# Patient Record
Sex: Male | Born: 1985 | Race: White | Hispanic: No | Marital: Single | Smoking: Never smoker
Health system: Southern US, Community
[De-identification: ages and names within clinical notes are randomized; demographics above are authoritative.]

## PROBLEM LIST (undated history)

## (undated) DIAGNOSIS — Z8547 Personal history of malignant neoplasm of testis: Secondary | ICD-10-CM

## (undated) DIAGNOSIS — F411 Generalized anxiety disorder: Secondary | ICD-10-CM

## (undated) DIAGNOSIS — E782 Mixed hyperlipidemia: Secondary | ICD-10-CM

## (undated) DIAGNOSIS — J45909 Unspecified asthma, uncomplicated: Secondary | ICD-10-CM

## (undated) DIAGNOSIS — J4520 Mild intermittent asthma, uncomplicated: Secondary | ICD-10-CM

## (undated) DIAGNOSIS — I10 Essential (primary) hypertension: Secondary | ICD-10-CM

## (undated) DIAGNOSIS — E119 Type 2 diabetes mellitus without complications: Secondary | ICD-10-CM

## (undated) HISTORY — DX: Personal history of malignant neoplasm of testis: Z85.47

## (undated) HISTORY — DX: Mixed hyperlipidemia: E78.2

## (undated) HISTORY — DX: Mild intermittent asthma, uncomplicated: J45.20

## (undated) HISTORY — DX: Generalized anxiety disorder: F41.1

## (undated) HISTORY — DX: Morbid (severe) obesity due to excess calories: E66.01

---

## 2012-10-12 DIAGNOSIS — R109 Unspecified abdominal pain: Secondary | ICD-10-CM | POA: Insufficient documentation

## 2012-10-13 DIAGNOSIS — E669 Obesity, unspecified: Secondary | ICD-10-CM

## 2012-10-13 DIAGNOSIS — N201 Calculus of ureter: Secondary | ICD-10-CM | POA: Insufficient documentation

## 2015-02-04 ENCOUNTER — Other Ambulatory Visit (HOSPITAL_COMMUNITY): Payer: Self-pay | Admitting: Urology

## 2015-02-04 DIAGNOSIS — N5089 Other specified disorders of the male genital organs: Secondary | ICD-10-CM

## 2015-02-05 ENCOUNTER — Encounter (HOSPITAL_COMMUNITY): Payer: Self-pay

## 2015-02-05 ENCOUNTER — Ambulatory Visit (HOSPITAL_COMMUNITY)
Admission: RE | Admit: 2015-02-05 | Discharge: 2015-02-05 | Disposition: A | Discharge status: Home / Self Care | Payer: BLUE CROSS/BLUE SHIELD | Source: Ambulatory Visit | Attending: Urology | Admitting: Urology

## 2015-02-05 DIAGNOSIS — N5089 Other specified disorders of the male genital organs: Secondary | ICD-10-CM

## 2015-02-05 DIAGNOSIS — N508 Other specified disorders of male genital organs: Secondary | ICD-10-CM | POA: Diagnosis not present

## 2015-02-05 DIAGNOSIS — K439 Ventral hernia without obstruction or gangrene: Secondary | ICD-10-CM | POA: Insufficient documentation

## 2015-02-05 HISTORY — DX: Unspecified asthma, uncomplicated: J45.909

## 2015-02-05 HISTORY — DX: Essential (primary) hypertension: I10

## 2015-02-05 HISTORY — DX: Type 2 diabetes mellitus without complications: E11.9

## 2015-02-05 MED ORDER — IOHEXOL 350 MG/ML SOLN
100.0000 mL | Freq: Once | INTRAVENOUS | Status: AC | PRN
  Administered 2015-02-05: 100 mL via INTRAVENOUS

## 2015-02-12 ENCOUNTER — Other Ambulatory Visit (HOSPITAL_COMMUNITY): Payer: Self-pay | Admitting: Urology

## 2015-02-12 ENCOUNTER — Ambulatory Visit (HOSPITAL_COMMUNITY)
Admission: RE | Admit: 2015-02-12 | Discharge: 2015-02-12 | Disposition: A | Discharge status: Home / Self Care | Payer: BLUE CROSS/BLUE SHIELD | Source: Ambulatory Visit | Attending: Urology | Admitting: Urology

## 2015-02-12 ENCOUNTER — Other Ambulatory Visit: Payer: Self-pay | Admitting: Urology

## 2015-02-12 DIAGNOSIS — N509 Disorder of male genital organs, unspecified: Secondary | ICD-10-CM | POA: Insufficient documentation

## 2015-02-12 DIAGNOSIS — E119 Type 2 diabetes mellitus without complications: Secondary | ICD-10-CM | POA: Insufficient documentation

## 2015-02-12 DIAGNOSIS — I10 Essential (primary) hypertension: Secondary | ICD-10-CM | POA: Insufficient documentation

## 2015-02-12 DIAGNOSIS — Z01818 Encounter for other preprocedural examination: Secondary | ICD-10-CM | POA: Insufficient documentation

## 2015-02-12 DIAGNOSIS — J45909 Unspecified asthma, uncomplicated: Secondary | ICD-10-CM | POA: Diagnosis not present

## 2015-02-16 ENCOUNTER — Encounter (HOSPITAL_COMMUNITY): Payer: Self-pay | Admitting: *Deleted

## 2015-02-17 ENCOUNTER — Encounter (HOSPITAL_COMMUNITY): Payer: Self-pay | Admitting: *Deleted

## 2015-02-19 ENCOUNTER — Ambulatory Visit (HOSPITAL_COMMUNITY)
Admission: RE | Admit: 2015-02-19 | Discharge: 2015-02-19 | Disposition: A | Discharge status: Home / Self Care | Payer: BLUE CROSS/BLUE SHIELD | Source: Ambulatory Visit | Attending: Urology | Admitting: Urology

## 2015-02-19 ENCOUNTER — Encounter (HOSPITAL_COMMUNITY)
Admission: RE | Disposition: A | Discharge status: Home / Self Care | Payer: Self-pay | Source: Ambulatory Visit | Attending: Urology

## 2015-02-19 ENCOUNTER — Ambulatory Visit (HOSPITAL_COMMUNITY): Payer: BLUE CROSS/BLUE SHIELD | Admitting: Anesthesiology

## 2015-02-19 ENCOUNTER — Encounter (HOSPITAL_COMMUNITY): Payer: Self-pay | Admitting: *Deleted

## 2015-02-19 DIAGNOSIS — Z79899 Other long term (current) drug therapy: Secondary | ICD-10-CM | POA: Diagnosis not present

## 2015-02-19 DIAGNOSIS — E119 Type 2 diabetes mellitus without complications: Secondary | ICD-10-CM | POA: Insufficient documentation

## 2015-02-19 DIAGNOSIS — I10 Essential (primary) hypertension: Secondary | ICD-10-CM | POA: Insufficient documentation

## 2015-02-19 DIAGNOSIS — N442 Benign cyst of testis: Secondary | ICD-10-CM | POA: Diagnosis not present

## 2015-02-19 DIAGNOSIS — J45909 Unspecified asthma, uncomplicated: Secondary | ICD-10-CM | POA: Insufficient documentation

## 2015-02-19 DIAGNOSIS — N508 Other specified disorders of male genital organs: Secondary | ICD-10-CM | POA: Diagnosis present

## 2015-02-19 DIAGNOSIS — Z6841 Body Mass Index (BMI) 40.0 and over, adult: Secondary | ICD-10-CM | POA: Insufficient documentation

## 2015-02-19 HISTORY — PX: ORCHIECTOMY: SHX2116

## 2015-02-19 LAB — BASIC METABOLIC PANEL
Anion gap: 8 (ref 5–15)
BUN: 14 mg/dL (ref 6–20)
CO2: 29 mmol/L (ref 22–32)
Calcium: 9 mg/dL (ref 8.9–10.3)
Chloride: 101 mmol/L (ref 101–111)
Creatinine, Ser: 0.79 mg/dL (ref 0.61–1.24)
Glucose, Bld: 166 mg/dL — ABNORMAL HIGH (ref 65–99)
Potassium: 4.5 mmol/L (ref 3.5–5.1)
Sodium: 138 mmol/L (ref 135–145)

## 2015-02-19 LAB — GLUCOSE, CAPILLARY
GLUCOSE-CAPILLARY: 164 mg/dL — AB (ref 65–99)
GLUCOSE-CAPILLARY: 166 mg/dL — AB (ref 65–99)

## 2015-02-19 LAB — CBC
HEMATOCRIT: 46.8 % (ref 39.0–52.0)
HEMOGLOBIN: 15.2 g/dL (ref 13.0–17.0)
MCH: 27.4 pg (ref 26.0–34.0)
MCHC: 32.5 g/dL (ref 30.0–36.0)
MCV: 84.5 fL (ref 78.0–100.0)
PLATELETS: 160 10*3/uL (ref 150–400)
RBC: 5.54 MIL/uL (ref 4.22–5.81)
RDW: 13.8 % (ref 11.5–15.5)
WBC: 5.5 10*3/uL (ref 4.0–10.5)

## 2015-02-19 SURGERY — ORCHIECTOMY
Anesthesia: General | Laterality: Left

## 2015-02-19 MED ORDER — MIDAZOLAM HCL 2 MG/2ML IJ SOLN
INTRAMUSCULAR | Status: AC
  Filled 2015-02-19: qty 2

## 2015-02-19 MED ORDER — OXYCODONE-ACETAMINOPHEN 5-325 MG PO TABS: 1.0000 | ORAL_TABLET | ORAL | Status: DC | PRN

## 2015-02-19 MED ORDER — OXYCODONE-ACETAMINOPHEN 5-325 MG PO TABS
1.0000 | ORAL_TABLET | ORAL | Status: DC | PRN
  Administered 2015-02-19: 1 via ORAL
  Filled 2015-02-19: qty 1

## 2015-02-19 MED ORDER — MEPERIDINE HCL 50 MG/ML IJ SOLN: 6.2500 mg | INTRAMUSCULAR | Status: DC | PRN

## 2015-02-19 MED ORDER — CEFAZOLIN SODIUM 10 G IJ SOLR
3.0000 g | INTRAMUSCULAR | Status: AC
  Administered 2015-02-19: 3 g via INTRAVENOUS
  Filled 2015-02-19: qty 3000

## 2015-02-19 MED ORDER — LACTATED RINGERS IV SOLN: INTRAVENOUS | Status: DC

## 2015-02-19 MED ORDER — PROMETHAZINE HCL 25 MG/ML IJ SOLN: 6.2500 mg | INTRAMUSCULAR | Status: DC | PRN

## 2015-02-19 MED ORDER — FENTANYL CITRATE (PF) 100 MCG/2ML IJ SOLN
INTRAMUSCULAR | Status: AC
  Filled 2015-02-19: qty 2

## 2015-02-19 MED ORDER — LIDOCAINE HCL (CARDIAC) 20 MG/ML IV SOLN
INTRAVENOUS | Status: DC | PRN
  Administered 2015-02-19: 100 mg via INTRAVENOUS

## 2015-02-19 MED ORDER — SODIUM CHLORIDE 0.9 % IJ SOLN
INTRAMUSCULAR | Status: AC
  Filled 2015-02-19: qty 10

## 2015-02-19 MED ORDER — LIDOCAINE HCL (CARDIAC) 20 MG/ML IV SOLN
INTRAVENOUS | Status: AC
  Filled 2015-02-19: qty 5

## 2015-02-19 MED ORDER — LACTATED RINGERS IV SOLN
INTRAVENOUS | Status: DC | PRN
  Administered 2015-02-19 (×2): via INTRAVENOUS

## 2015-02-19 MED ORDER — PROPOFOL 10 MG/ML IV BOLUS
INTRAVENOUS | Status: DC | PRN
  Administered 2015-02-19: 300 mg via INTRAVENOUS

## 2015-02-19 MED ORDER — ACETAMINOPHEN 10 MG/ML IV SOLN
INTRAVENOUS | Status: DC | PRN
  Administered 2015-02-19: 1000 mg via INTRAVENOUS

## 2015-02-19 MED ORDER — EPHEDRINE SULFATE 50 MG/ML IJ SOLN
INTRAMUSCULAR | Status: AC
  Filled 2015-02-19: qty 1

## 2015-02-19 MED ORDER — ACETAMINOPHEN 10 MG/ML IV SOLN
INTRAVENOUS | Status: AC
  Filled 2015-02-19: qty 100

## 2015-02-19 MED ORDER — BUPIVACAINE-EPINEPHRINE (PF) 0.25% -1:200000 IJ SOLN
INTRAMUSCULAR | Status: AC
  Filled 2015-02-19: qty 30

## 2015-02-19 MED ORDER — FENTANYL CITRATE (PF) 100 MCG/2ML IJ SOLN
INTRAMUSCULAR | Status: DC | PRN
  Administered 2015-02-19 (×8): 25 ug via INTRAVENOUS

## 2015-02-19 MED ORDER — MIDAZOLAM HCL 5 MG/5ML IJ SOLN
INTRAMUSCULAR | Status: DC | PRN
  Administered 2015-02-19: 2 mg via INTRAVENOUS

## 2015-02-19 MED ORDER — PROPOFOL 10 MG/ML IV BOLUS
INTRAVENOUS | Status: AC
  Filled 2015-02-19: qty 20

## 2015-02-19 MED ORDER — FENTANYL CITRATE (PF) 100 MCG/2ML IJ SOLN
25.0000 ug | INTRAMUSCULAR | Status: DC | PRN
  Administered 2015-02-19 (×4): 25 ug via INTRAVENOUS

## 2015-02-19 MED ORDER — BUPIVACAINE-EPINEPHRINE (PF) 0.25% -1:200000 IJ SOLN
INTRAMUSCULAR | Status: DC | PRN
  Administered 2015-02-19: 20 mL

## 2015-02-19 MED ORDER — ONDANSETRON HCL 4 MG/2ML IJ SOLN
INTRAMUSCULAR | Status: DC | PRN
  Administered 2015-02-19: 4 mg via INTRAVENOUS

## 2015-02-19 MED ORDER — 0.9 % SODIUM CHLORIDE (POUR BTL) OPTIME
TOPICAL | Status: DC | PRN
  Administered 2015-02-19: 1000 mL

## 2015-02-19 MED ORDER — SULFAMETHOXAZOLE-TRIMETHOPRIM 800-160 MG PO TABS: 1.0000 | ORAL_TABLET | Freq: Two times a day (BID) | ORAL | Status: DC

## 2015-02-19 MED ORDER — KETOROLAC TROMETHAMINE 30 MG/ML IJ SOLN
INTRAMUSCULAR | Status: DC | PRN
  Administered 2015-02-19: 30 mg via INTRAVENOUS

## 2015-02-19 SURGICAL SUPPLY — 32 items
BENZOIN TINCTURE PRP APPL 2/3 (GAUZE/BANDAGES/DRESSINGS) IMPLANT
BLADE HEX COATED 2.75 (ELECTRODE) ×2 IMPLANT
BLADE SURG 15 STRL LF DISP TIS (BLADE) ×1 IMPLANT
BLADE SURG 15 STRL SS (BLADE) ×1
BNDG GAUZE ELAST 4 BULKY (GAUZE/BANDAGES/DRESSINGS) ×2 IMPLANT
COVER SURGICAL LIGHT HANDLE (MISCELLANEOUS) ×2 IMPLANT
DRAIN PENROSE 18X1/2 LTX STRL (DRAIN) ×2 IMPLANT
DRAIN PENROSE 18X1/4 LTX STRL (WOUND CARE) ×2 IMPLANT
DRAPE PED LAPAROTOMY (DRAPES) ×2 IMPLANT
ELECT REM PT RETURN 9FT ADLT (ELECTROSURGICAL) ×2
ELECTRODE REM PT RTRN 9FT ADLT (ELECTROSURGICAL) ×1 IMPLANT
GAUZE SPONGE 4X4 12PLY STRL (GAUZE/BANDAGES/DRESSINGS) ×2 IMPLANT
GLOVE BIOGEL M STRL SZ7.5 (GLOVE) ×2 IMPLANT
GOWN STRL REUS W/TWL LRG LVL3 (GOWN DISPOSABLE) ×4 IMPLANT
KIT BASIN OR (CUSTOM PROCEDURE TRAY) ×2 IMPLANT
LIQUID BAND (GAUZE/BANDAGES/DRESSINGS) IMPLANT
NEEDLE HYPO 22GX1.5 SAFETY (NEEDLE) IMPLANT
NS IRRIG 1000ML POUR BTL (IV SOLUTION) IMPLANT
PACK BASIC VI WITH GOWN DISP (CUSTOM PROCEDURE TRAY) ×2 IMPLANT
PENCIL BUTTON HOLSTER BLD 10FT (ELECTRODE) ×2 IMPLANT
SPONGE LAP 4X18 X RAY DECT (DISPOSABLE) ×2 IMPLANT
SUPPORT SCROTAL LG STRP (MISCELLANEOUS) IMPLANT
SUT MNCRL AB 4-0 PS2 18 (SUTURE) ×4 IMPLANT
SUT SILK 0 (SUTURE) ×1
SUT SILK 0 30XBRD TIE 6 (SUTURE) ×1 IMPLANT
SUT VIC AB 2-0 SH 27 (SUTURE) ×2
SUT VIC AB 2-0 SH 27X BRD (SUTURE) ×2 IMPLANT
SUT VIC AB 3-0 SH 27 (SUTURE)
SUT VIC AB 3-0 SH 27XBRD (SUTURE) IMPLANT
SYR 20CC LL (SYRINGE) ×2 IMPLANT
SYR CONTROL 10ML LL (SYRINGE) IMPLANT
WATER STERILE IRR 1500ML POUR (IV SOLUTION) IMPLANT

## 2015-02-19 NOTE — Brief Op Note (Signed)
02/19/2015  9:33 AM  PATIENT:  William Massey  29 y.o. male  PRE-OPERATIVE DIAGNOSIS:  LEFT  TESTICULAR MASS  POST-OPERATIVE DIAGNOSIS:  LEFT  TESTICULAR MASS  PROCEDURE:  Procedure(s): RADICAL ORCHIECTOMY (Left)  SURGEON:  Surgeon(s) and Role:    * Malen GauzePatrick L Sheilla Maris, MD - Primary    * Sebastian Acheheodore Manny, MD - Assisting  PHYSICIAN ASSISTANT:   ASSISTANTS: Dwana Curded Manny MD   ANESTHESIA:   local and general  EBL:     BLOOD ADMINISTERED:none  DRAINS: Penrose drain in the LLQ   LOCAL MEDICATIONS USED:  MARCAINE     SPECIMEN:  Left testis and cord  DISPOSITION OF SPECIMEN:  PATHOLOGY  COUNTS:  YES  TOURNIQUET:  * No tourniquets in log *  DICTATION: .Dragon Dictation  PLAN OF CARE: Discharge to home after PACU  PATIENT DISPOSITION:  PACU - hemodynamically stable.   Delay start of Pharmacological VTE agent (>24hrs) due to surgical blood loss or risk of bleeding: not applicable

## 2015-02-19 NOTE — Anesthesia Postprocedure Evaluation (Signed)
  Anesthesia Post-op Note  Patient: William CoveGarrett Wilden  Procedure(s) Performed: Procedure(s) (LRB): RADICAL ORCHIECTOMY (Left)  Patient Location: PACU  Anesthesia Type: General  Level of Consciousness: awake and alert   Airway and Oxygen Therapy: Patient Spontanous Breathing  Post-op Pain: mild  Post-op Assessment: Post-op Vital signs reviewed, Patient's Cardiovascular Status Stable, Respiratory Function Stable, Patent Airway and No signs of Nausea or vomiting  Last Vitals:  Filed Vitals:   02/19/15 1030  BP: 137/81  Pulse: 95  Temp:   Resp: 11    Post-op Vital Signs: stable   Complications: No apparent anesthesia complications

## 2015-02-19 NOTE — H&P (Signed)
Urology Admission H&P  Chief Complaint: left scrotal swelling  History of Present Illness: [name] is a 29yo with new onset left scrotal swelling that has been gradually worsening for 3 months. He underwent scrotal US which showed a complex left testicular mass. CT scan shows a cystic mass replacing the left testis. He denies any fevers/chills/sweats. He denies any pain.  Past Medical History  Diagnosis Date  . Asthma   . Diabetes mellitus without complication   . Hypertension    History reviewed. No pertinent past surgical history.  Home Medications:  Prescriptions prior to admission  Medication Sig Dispense Refill Last Dose  . glimepiride (AMARYL) 4 MG tablet Take 1 tablet by mouth daily.  5 02/17/2015 at 0830  . lisinopril-hydrochlorothiazide (PRINZIDE,ZESTORETIC) 20-12.5 MG per tablet Take 1 tablet by mouth daily.  11 02/17/2015 at 0830   Allergies: No Known Allergies  History reviewed. No pertinent family history. Social History:  reports that he has never smoked. He has never used smokeless tobacco. He reports that he drinks alcohol. He reports that he uses illicit drugs (Marijuana).  Review of Systems  All other systems reviewed and are negative.   Physical Exam:  Vital signs in last 24 hours: Temp:  [98.7 F (37.1 C)] 98.7 F (37.1 C) (07/01 0548) Pulse Rate:  [99] 99 (07/01 0548) Resp:  [20] 20 (07/01 0548) BP: (151)/(101) 151/101 mmHg (07/01 0548) SpO2:  [96 %] 96 % (07/01 0548) Weight:  [248.571 kg (548 lb)] 248.571 kg (548 lb) (07/01 0547) Physical Exam  Constitutional: He is oriented to person, place, and time. He appears well-developed and well-nourished.  HENT:  Head: Normocephalic and atraumatic.  Eyes: EOM are normal. Pupils are equal, round, and reactive to light.  Neck: Normal range of motion. Neck supple. No thyromegaly present.  Cardiovascular: Normal rate and regular rhythm.   Respiratory: Effort normal and breath sounds normal.  GI: Soft. He  exhibits no distension. There is no tenderness. There is no rebound.  Musculoskeletal: Normal range of motion.  Neurological: He is alert and oriented to person, place, and time.  Skin: Skin is warm and dry.  Psychiatric: He has a normal mood and affect. His behavior is normal. Judgment and thought content normal.    Laboratory Data:  Results for orders placed or performed during the hospital encounter of 02/19/15 (from the past 24 hour(s))  CBC     Status: None   Collection Time: 02/19/15  6:00 AM  Result Value Ref Range   WBC 5.5 4.0 - 10.5 K/uL   RBC 5.54 4.22 - 5.81 MIL/uL   Hemoglobin 15.2 13.0 - 17.0 g/dL   HCT 91.446.8 78.239.0 - 95.652.0 %   MCV 84.5 78.0 - 100.0 fL   MCH 27.4 26.0 - 34.0 pg   MCHC 32.5 30.0 - 36.0 g/dL   RDW 21.313.8 08.611.5 - 57.815.5 %   Platelets 160 150 - 400 K/uL  Basic metabolic panel     Status: Abnormal   Collection Time: 02/19/15  6:00 AM  Result Value Ref Range   Sodium 138 135 - 145 mmol/L   Potassium 4.5 3.5 - 5.1 mmol/L   Chloride 101 101 - 111 mmol/L   CO2 29 22 - 32 mmol/L   Glucose, Bld 166 (H) 65 - 99 mg/dL   BUN 14 6 - 20 mg/dL   Creatinine, Ser 4.690.79 0.61 - 1.24 mg/dL   Calcium 9.0 8.9 - 62.910.3 mg/dL   GFR calc non Af Amer >60 >60 mL/min  GFR calc Af Amer >60 >60 mL/min   Anion gap 8 5 - 15  Glucose, capillary     Status: Abnormal   Collection Time: 02/19/15  6:03 AM  Result Value Ref Range   Glucose-Capillary 164 (H) 65 - 99 mg/dL   Comment 1 Notify RN    No results found for this or any previous visit (from the past 240 hour(s)). Creatinine:  Recent Labs  02/19/15 0600  CREATININE 0.79     Impression/Assessment:  Left testicular mass  Plan:  1. Risks/benefits/alternatives to left radical orchiectomy was explained to the pt and he understands and wishes to proceed with surgery. Pt scheduled for today.  Whitley Patchen L 02/19/2015, 7:22 AM

## 2015-02-19 NOTE — Anesthesia Preprocedure Evaluation (Addendum)
Anesthesia Evaluation  Patient identified by MRN, date of birth, ID band Patient awake    Reviewed: Allergy & Precautions, NPO status , Patient's Chart, lab work & pertinent test results  Airway Mallampati: II  TM Distance: >3 FB Neck ROM: Full    Dental no notable dental hx.    Pulmonary asthma ,  breath sounds clear to auscultation  Pulmonary exam normal       Cardiovascular hypertension, Pt. on medications Normal cardiovascular examRhythm:Regular Rate:Normal     Neuro/Psych negative neurological ROS  negative psych ROS   GI/Hepatic negative GI ROS, Neg liver ROS,   Endo/Other  diabetes, Type 2, Oral Hypoglycemic AgentsMorbid obesity  Renal/GU negative Renal ROS  negative genitourinary   Musculoskeletal negative musculoskeletal ROS (+)   Abdominal   Peds negative pediatric ROS (+)  Hematology negative hematology ROS (+)   Anesthesia Other Findings   Reproductive/Obstetrics negative OB ROS                            Anesthesia Physical Anesthesia Plan  ASA: IV  Anesthesia Plan: General   Post-op Pain Management:    Induction: Intravenous  Airway Management Planned: LMA  Additional Equipment:   Intra-op Plan:   Post-operative Plan: Extubation in OR  Informed Consent: I have reviewed the patients History and Physical, chart, labs and discussed the procedure including the risks, benefits and alternatives for the proposed anesthesia with the patient or authorized representative who has indicated his/her understanding and acceptance.   Dental advisory given  Plan Discussed with: CRNA  Anesthesia Plan Comments:        Anesthesia Quick Evaluation

## 2015-02-19 NOTE — Discharge Instructions (Signed)
What to do after you leave the hospital:  Recommended diet: diabetic diet  Recommended activity: no lifting, driving, or strenuous exercise for 7 days   Pt can shower in 48 hours but no tub bathing  Follow-up with Dr. Ronne BinningMckenzie in 1 week  Follow up with Dr. Ronne BinningMcKenzie if you experience any of the following symptoms: fever over 101.5, increased pain, increased bleeding, nausea/vomiting.  Other instructions:  none

## 2015-02-19 NOTE — Anesthesia Procedure Notes (Signed)
Procedure Name: LMA Insertion Date/Time: 02/19/2015 7:41 AM Performed by: Leroy LibmanEARDON, Yuval Rubens L Patient Re-evaluated:Patient Re-evaluated prior to inductionOxygen Delivery Method: Circle system utilized Preoxygenation: Pre-oxygenation with 100% oxygen Intubation Type: IV induction Ventilation: Mask ventilation without difficulty LMA: LMA inserted LMA Size: 5.0 Number of attempts: 1 Airway Equipment and Method: Patient positioned with wedge pillow Placement Confirmation: positive ETCO2 and breath sounds checked- equal and bilateral Tube secured with: Tape Dental Injury: Teeth and Oropharynx as per pre-operative assessment

## 2015-02-19 NOTE — Transfer of Care (Signed)
Immediate Anesthesia Transfer of Care Note  Patient: Fredrik CoveGarrett Toor  Procedure(s) Performed: Procedure(s): RADICAL ORCHIECTOMY (Left)  Patient Location: PACU  Anesthesia Type:General  Level of Consciousness: awake, alert  and oriented  Airway & Oxygen Therapy: Patient Spontanous Breathing and Patient connected to face mask oxygen  Post-op Assessment: Report given to RN and Post -op Vital signs reviewed and stable  Post vital signs: Reviewed and stable  Last Vitals:  Filed Vitals:   02/19/15 0548  BP: 151/101  Pulse: 99  Temp: 37.1 C  Resp: 20    Complications: No apparent anesthesia complications

## 2015-02-23 ENCOUNTER — Encounter (HOSPITAL_COMMUNITY): Payer: Self-pay | Admitting: Urology

## 2015-03-04 NOTE — Op Note (Signed)
Preoperative diagnosis: Left testicular Mass.  Postoperative diagnosis: Same  Procedure: Left radical inguinal orchiectomy  Attending: Cleda MccreedyPatrick Mackenzie, MD  Second Surgeon: Sebastian Acheheodore Manny, MD  Anesthesia: General  History of blood loss: Minimal  Antibiotics: Ancef  Drains: Quarter-inch Penrose  Findings: 10 cm left testicular mass that was densely adherent to the scrotal wall.  The mass was removed intact. No scrotal wall violation  Indications: Patient is a 29 year old male with a history of left testicular mass that was growing in size.  We discussed the treatment options including observation versus radical orchiectomy after discussing treatment options he decided to proceed with radical orchiectomy.  Due to patient's body habitus a second surgeon was needed to assist with dissection.  Procedure in detail: Prior to procedure consent was obtained.  Patient was brought to the operating room and a brief timeout was done to ensure correct patient, correct procedure, correct site.  General anesthesia was administered and patient was placed in supine position.  His genitalia and abdomen was then prepped and draped in usual sterile fashion.  A 5 cm incision was made in the inguinal fold.  We dissected down through the subcutaneous tissue to until we reached the spermatic cord.  The spermatic cord was identified and a Penrose drain was looped around the cord.  We then proceeded to bluntly and sharply dissected the attachments of the testicle to the scrotum.  Once this was done the testicle was then brought into the operative field.  We then proceeded to dissected the gubernaculum with electrocautery.  Once the testicle was freed from its attachments were then turned our attention the spermatic cord.  We separated the spermatic cord into 3 distinct packets.  The packets were then ligated with 0 silk ties.  Once this was done we then sharply cut the spermatic cord and the spot testicle was then sent  for pathology.  We then inspected the scrotum in the operative bed and we noted no residual bleeding.  We then elected to place a quarter-inch Penrose drain.  The Penrose brought through a separate incision is approximate 1 cm just inferior to the main incision.  We then attached the Penrose with a nylon stitch.  We then closed the subcutaneous tissues in 2 layers with 2-0 Vicryl in a running fashion.  We then loosely closed the skin with 4-0 Monocryl in a running fashion.  A dressing was then applied to the incision and to the Penrose drain.  We then placed a scrotal fluff and this then concluded the procedure which was well tolerated by the patient.  Complications: None  Specimens: Left radical orchiectomy  Condition: Stable, extubated, transferred to PACU.  Plan: Patient is to be discharged home.  He is to follow up in 2 weeks for wound check and drain removal.

## 2015-07-16 ENCOUNTER — Inpatient Hospital Stay (HOSPITAL_COMMUNITY)
Admission: EM | Admit: 2015-07-16 | Discharge: 2015-07-21 | DRG: 872 | Disposition: A | Discharge status: Home Health | Payer: BLUE CROSS/BLUE SHIELD | Attending: Student in an Organized Health Care Education/Training Program | Admitting: Student in an Organized Health Care Education/Training Program

## 2015-07-16 ENCOUNTER — Encounter (HOSPITAL_COMMUNITY): Payer: Self-pay | Admitting: Emergency Medicine

## 2015-07-16 DIAGNOSIS — Z9119 Patient's noncompliance with other medical treatment and regimen: Secondary | ICD-10-CM

## 2015-07-16 DIAGNOSIS — L0291 Cutaneous abscess, unspecified: Secondary | ICD-10-CM | POA: Insufficient documentation

## 2015-07-16 DIAGNOSIS — L0390 Cellulitis, unspecified: Secondary | ICD-10-CM

## 2015-07-16 DIAGNOSIS — E1165 Type 2 diabetes mellitus with hyperglycemia: Secondary | ICD-10-CM

## 2015-07-16 DIAGNOSIS — A419 Sepsis, unspecified organism: Secondary | ICD-10-CM | POA: Diagnosis not present

## 2015-07-16 DIAGNOSIS — N492 Inflammatory disorders of scrotum: Secondary | ICD-10-CM | POA: Diagnosis present

## 2015-07-16 DIAGNOSIS — N5082 Scrotal pain: Secondary | ICD-10-CM

## 2015-07-16 DIAGNOSIS — E119 Type 2 diabetes mellitus without complications: Secondary | ICD-10-CM

## 2015-07-16 DIAGNOSIS — B951 Streptococcus, group B, as the cause of diseases classified elsewhere: Secondary | ICD-10-CM | POA: Diagnosis present

## 2015-07-16 DIAGNOSIS — E1159 Type 2 diabetes mellitus with other circulatory complications: Secondary | ICD-10-CM

## 2015-07-16 DIAGNOSIS — I10 Essential (primary) hypertension: Secondary | ICD-10-CM | POA: Diagnosis present

## 2015-07-16 DIAGNOSIS — E669 Obesity, unspecified: Secondary | ICD-10-CM

## 2015-07-16 DIAGNOSIS — I152 Hypertension secondary to endocrine disorders: Secondary | ICD-10-CM

## 2015-07-16 DIAGNOSIS — Z6841 Body Mass Index (BMI) 40.0 and over, adult: Secondary | ICD-10-CM

## 2015-07-16 DIAGNOSIS — K5900 Constipation, unspecified: Secondary | ICD-10-CM | POA: Diagnosis present

## 2015-07-16 DIAGNOSIS — N5089 Other specified disorders of the male genital organs: Secondary | ICD-10-CM | POA: Diagnosis present

## 2015-07-16 NOTE — ED Notes (Signed)
Patient c/o scrotum swelling and pain. Patient had surgery on 02/19/2015 with [facility] to have one testicle removed. Patient called [facility] and they sent him here for evaluation. Denies injuries.

## 2015-07-17 ENCOUNTER — Emergency Department (HOSPITAL_COMMUNITY): Payer: BLUE CROSS/BLUE SHIELD

## 2015-07-17 DIAGNOSIS — Z9079 Acquired absence of other genital organ(s): Secondary | ICD-10-CM | POA: Diagnosis not present

## 2015-07-17 DIAGNOSIS — R509 Fever, unspecified: Secondary | ICD-10-CM | POA: Diagnosis not present

## 2015-07-17 DIAGNOSIS — Z9889 Other specified postprocedural states: Secondary | ICD-10-CM | POA: Diagnosis not present

## 2015-07-17 DIAGNOSIS — Z9114 Patient's other noncompliance with medication regimen: Secondary | ICD-10-CM

## 2015-07-17 DIAGNOSIS — E1159 Type 2 diabetes mellitus with other circulatory complications: Secondary | ICD-10-CM

## 2015-07-17 DIAGNOSIS — I152 Hypertension secondary to endocrine disorders: Secondary | ICD-10-CM

## 2015-07-17 DIAGNOSIS — N492 Inflammatory disorders of scrotum: Secondary | ICD-10-CM | POA: Diagnosis present

## 2015-07-17 DIAGNOSIS — N5089 Other specified disorders of the male genital organs: Secondary | ICD-10-CM | POA: Diagnosis present

## 2015-07-17 DIAGNOSIS — B9689 Other specified bacterial agents as the cause of diseases classified elsewhere: Secondary | ICD-10-CM | POA: Diagnosis not present

## 2015-07-17 DIAGNOSIS — Z6841 Body Mass Index (BMI) 40.0 and over, adult: Secondary | ICD-10-CM | POA: Diagnosis not present

## 2015-07-17 DIAGNOSIS — K5900 Constipation, unspecified: Secondary | ICD-10-CM | POA: Diagnosis present

## 2015-07-17 DIAGNOSIS — Z9119 Patient's noncompliance with other medical treatment and regimen: Secondary | ICD-10-CM | POA: Diagnosis not present

## 2015-07-17 DIAGNOSIS — E119 Type 2 diabetes mellitus without complications: Secondary | ICD-10-CM

## 2015-07-17 DIAGNOSIS — A419 Sepsis, unspecified organism: Secondary | ICD-10-CM | POA: Diagnosis present

## 2015-07-17 DIAGNOSIS — B951 Streptococcus, group B, as the cause of diseases classified elsewhere: Secondary | ICD-10-CM | POA: Diagnosis present

## 2015-07-17 DIAGNOSIS — I10 Essential (primary) hypertension: Secondary | ICD-10-CM

## 2015-07-17 DIAGNOSIS — N5082 Scrotal pain: Secondary | ICD-10-CM | POA: Diagnosis present

## 2015-07-17 DIAGNOSIS — E1165 Type 2 diabetes mellitus with hyperglycemia: Secondary | ICD-10-CM | POA: Diagnosis present

## 2015-07-17 LAB — CBC WITH DIFFERENTIAL/PLATELET
BASOS ABS: 0 10*3/uL (ref 0.0–0.1)
BASOS PCT: 0 %
Eosinophils Absolute: 0.1 10*3/uL (ref 0.0–0.7)
Eosinophils Relative: 1 %
HEMATOCRIT: 43.9 % (ref 39.0–52.0)
HEMOGLOBIN: 15 g/dL (ref 13.0–17.0)
LYMPHS PCT: 18 %
Lymphs Abs: 2.3 10*3/uL (ref 0.7–4.0)
MCH: 28.8 pg (ref 26.0–34.0)
MCHC: 34.2 g/dL (ref 30.0–36.0)
MCV: 84.3 fL (ref 78.0–100.0)
Monocytes Absolute: 1.4 10*3/uL — ABNORMAL HIGH (ref 0.1–1.0)
Monocytes Relative: 11 %
NEUTROS ABS: 8.9 10*3/uL — AB (ref 1.7–7.7)
NEUTROS PCT: 70 %
Platelets: 186 10*3/uL (ref 150–400)
RBC: 5.21 MIL/uL (ref 4.22–5.81)
RDW: 13.6 % (ref 11.5–15.5)
WBC: 12.7 10*3/uL — AB (ref 4.0–10.5)

## 2015-07-17 LAB — BASIC METABOLIC PANEL
ANION GAP: 12 (ref 5–15)
BUN: 12 mg/dL (ref 6–20)
CHLORIDE: 97 mmol/L — AB (ref 101–111)
CO2: 26 mmol/L (ref 22–32)
Calcium: 9.4 mg/dL (ref 8.9–10.3)
Creatinine, Ser: 0.84 mg/dL (ref 0.61–1.24)
GFR calc Af Amer: >60 mL/min (ref >60)
Glucose, Bld: 480 mg/dL — ABNORMAL HIGH (ref 65–99)
POTASSIUM: 4.3 mmol/L (ref 3.5–5.1)
SODIUM: 135 mmol/L (ref 135–145)

## 2015-07-17 LAB — URINE MICROSCOPIC-ADD ON
Bacteria, UA: NONE SEEN
RBC / HPF: NONE SEEN RBC/hpf (ref 0–5)

## 2015-07-17 LAB — I-STAT CHEM 8, ED
BUN: 13 mg/dL (ref 6–20)
CREATININE: 0.8 mg/dL (ref 0.61–1.24)
Calcium, Ion: 1.08 mmol/L — ABNORMAL LOW (ref 1.12–1.23)
Chloride: 98 mmol/L — ABNORMAL LOW (ref 101–111)
Glucose, Bld: 493 mg/dL — ABNORMAL HIGH (ref 65–99)
HEMATOCRIT: 50 % (ref 39.0–52.0)
HEMOGLOBIN: 17 g/dL (ref 13.0–17.0)
POTASSIUM: 4.2 mmol/L (ref 3.5–5.1)
SODIUM: 135 mmol/L (ref 135–145)
TCO2: 26 mmol/L (ref 0–100)

## 2015-07-17 LAB — I-STAT CG4 LACTIC ACID, ED
LACTIC ACID, VENOUS: 2.34 mmol/L — AB (ref 0.5–2.0)
LACTIC ACID, VENOUS: 1.11 mmol/L (ref 0.5–2.0)

## 2015-07-17 LAB — URINALYSIS, ROUTINE W REFLEX MICROSCOPIC
Bilirubin Urine: NEGATIVE
Glucose, UA: >1000 mg/dL — AB
HGB URINE DIPSTICK: NEGATIVE
Ketones, ur: NEGATIVE mg/dL
Leukocytes, UA: NEGATIVE
Nitrite: NEGATIVE
Protein, ur: NEGATIVE mg/dL
SPECIFIC GRAVITY, URINE: 1.042 — AB (ref 1.005–1.030)
pH: 5.5 (ref 5.0–8.0)

## 2015-07-17 LAB — GLUCOSE, CAPILLARY
Glucose-Capillary: 329 mg/dL — ABNORMAL HIGH (ref 65–99)
Glucose-Capillary: 250 mg/dL — ABNORMAL HIGH (ref 65–99)

## 2015-07-17 LAB — CBG MONITORING, ED: Glucose-Capillary: 334 mg/dL — ABNORMAL HIGH (ref 65–99)

## 2015-07-17 MED ORDER — SENNOSIDES-DOCUSATE SODIUM 8.6-50 MG PO TABS: 1.0000 | ORAL_TABLET | Freq: Every evening | ORAL | Status: DC | PRN

## 2015-07-17 MED ORDER — INSULIN ASPART 100 UNIT/ML ~~LOC~~ SOLN
0.0000 [IU] | Freq: Every day | SUBCUTANEOUS | Status: DC
  Administered 2015-07-17 – 2015-07-19 (×3): 2 [IU] via SUBCUTANEOUS

## 2015-07-17 MED ORDER — ACETAMINOPHEN 325 MG PO TABS
650.0000 mg | ORAL_TABLET | Freq: Four times a day (QID) | ORAL | Status: DC | PRN
  Administered 2015-07-17: 650 mg via ORAL
  Filled 2015-07-17: qty 2

## 2015-07-17 MED ORDER — DEXTROSE 5 % IV SOLN
2.0000 g | Freq: Three times a day (TID) | INTRAVENOUS | Status: DC
  Administered 2015-07-17 – 2015-07-21 (×12): 2 g via INTRAVENOUS
  Filled 2015-07-17 (×16): qty 2

## 2015-07-17 MED ORDER — ENOXAPARIN SODIUM 40 MG/0.4ML ~~LOC~~ SOLN: 40.0000 mg | SUBCUTANEOUS | Status: DC

## 2015-07-17 MED ORDER — INSULIN ASPART 100 UNIT/ML ~~LOC~~ SOLN
0.0000 [IU] | Freq: Three times a day (TID) | SUBCUTANEOUS | Status: DC
  Administered 2015-07-17: 11 [IU] via SUBCUTANEOUS
  Administered 2015-07-18: 5 [IU] via SUBCUTANEOUS
  Administered 2015-07-18: 8 [IU] via SUBCUTANEOUS
  Administered 2015-07-18 – 2015-07-19 (×4): 5 [IU] via SUBCUTANEOUS
  Administered 2015-07-20: 3 [IU] via SUBCUTANEOUS
  Administered 2015-07-20: 5 [IU] via SUBCUTANEOUS
  Administered 2015-07-21: 3 [IU] via SUBCUTANEOUS

## 2015-07-17 MED ORDER — SODIUM CHLORIDE 0.9 % IV SOLN
INTRAVENOUS | Status: AC
  Administered 2015-07-17: 13:00:00 via INTRAVENOUS

## 2015-07-17 MED ORDER — ACETAMINOPHEN 650 MG RE SUPP: 650.0000 mg | Freq: Four times a day (QID) | RECTAL | Status: DC | PRN

## 2015-07-17 MED ORDER — ACETAMINOPHEN 500 MG PO TABS
1000.0000 mg | ORAL_TABLET | Freq: Once | ORAL | Status: AC
  Administered 2015-07-17: 1000 mg via ORAL
  Filled 2015-07-17: qty 2

## 2015-07-17 MED ORDER — ONDANSETRON HCL 4 MG PO TABS: 4.0000 mg | ORAL_TABLET | Freq: Four times a day (QID) | ORAL | Status: DC | PRN

## 2015-07-17 MED ORDER — INSULIN GLARGINE 100 UNIT/ML ~~LOC~~ SOLN
10.0000 [IU] | Freq: Every day | SUBCUTANEOUS | Status: DC
  Administered 2015-07-17: 10 [IU] via SUBCUTANEOUS
  Filled 2015-07-17 (×3): qty 0.1

## 2015-07-17 MED ORDER — MORPHINE SULFATE (PF) 4 MG/ML IV SOLN
4.0000 mg | Freq: Once | INTRAVENOUS | Status: AC
  Administered 2015-07-17: 4 mg via INTRAVENOUS
  Filled 2015-07-17: qty 1

## 2015-07-17 MED ORDER — IOHEXOL 300 MG/ML  SOLN
100.0000 mL | Freq: Once | INTRAMUSCULAR | Status: AC | PRN
  Administered 2015-07-17: 100 mL via INTRAVENOUS

## 2015-07-17 MED ORDER — VANCOMYCIN HCL 10 G IV SOLR
1500.0000 mg | Freq: Three times a day (TID) | INTRAVENOUS | Status: DC
  Administered 2015-07-17 – 2015-07-20 (×10): 1500 mg via INTRAVENOUS
  Filled 2015-07-17 (×13): qty 1500

## 2015-07-17 MED ORDER — DEXTROSE 5 % IV SOLN
2.0000 g | Freq: Once | INTRAVENOUS | Status: AC
  Administered 2015-07-17: 2 g via INTRAVENOUS
  Filled 2015-07-17: qty 2

## 2015-07-17 MED ORDER — INSULIN ASPART 100 UNIT/ML ~~LOC~~ SOLN
6.0000 [IU] | Freq: Three times a day (TID) | SUBCUTANEOUS | Status: DC
  Administered 2015-07-17 – 2015-07-19 (×7): 6 [IU] via SUBCUTANEOUS

## 2015-07-17 MED ORDER — CLINDAMYCIN PHOSPHATE 900 MG/50ML IV SOLN
900.0000 mg | Freq: Once | INTRAVENOUS | Status: AC
  Administered 2015-07-17: 900 mg via INTRAVENOUS
  Filled 2015-07-17: qty 50

## 2015-07-17 MED ORDER — ENOXAPARIN SODIUM 120 MG/0.8ML ~~LOC~~ SOLN
120.0000 mg | SUBCUTANEOUS | Status: DC
  Administered 2015-07-17 – 2015-07-20 (×4): 120 mg via SUBCUTANEOUS
  Filled 2015-07-17 (×5): qty 0.8

## 2015-07-17 MED ORDER — VANCOMYCIN HCL 10 G IV SOLR
2000.0000 mg | Freq: Once | INTRAVENOUS | Status: AC
  Administered 2015-07-17: 2000 mg via INTRAVENOUS
  Filled 2015-07-17: qty 2000

## 2015-07-17 MED ORDER — SODIUM CHLORIDE 0.9 % IV BOLUS (SEPSIS)
2000.0000 mL | Freq: Once | INTRAVENOUS | Status: AC
Start: 2015-07-17 — End: 2015-07-17
  Administered 2015-07-17: 2000 mL via INTRAVENOUS

## 2015-07-17 MED ORDER — SODIUM CHLORIDE 0.9 % IJ SOLN
3.0000 mL | Freq: Two times a day (BID) | INTRAMUSCULAR | Status: DC
  Administered 2015-07-17 – 2015-07-21 (×6): 3 mL via INTRAVENOUS

## 2015-07-17 MED ORDER — ONDANSETRON HCL 4 MG/2ML IJ SOLN: 4.0000 mg | Freq: Four times a day (QID) | INTRAMUSCULAR | Status: DC | PRN

## 2015-07-17 MED ORDER — SODIUM CHLORIDE 0.9 % IV BOLUS (SEPSIS)
2000.0000 mL | Freq: Once | INTRAVENOUS | Status: AC
  Administered 2015-07-17: 1000 mL via INTRAVENOUS

## 2015-07-17 MED ORDER — MORPHINE SULFATE (PF) 2 MG/ML IV SOLN
2.0000 mg | INTRAVENOUS | Status: DC | PRN
  Administered 2015-07-17: 2 mg via INTRAVENOUS
  Filled 2015-07-17: qty 1

## 2015-07-17 MED ORDER — OXYCODONE HCL 5 MG PO TABS
5.0000 mg | ORAL_TABLET | ORAL | Status: DC | PRN
  Administered 2015-07-17 – 2015-07-21 (×6): 5 mg via ORAL
  Filled 2015-07-17 (×6): qty 1

## 2015-07-17 NOTE — ED Notes (Signed)
Admitting MD at bedside.

## 2015-07-17 NOTE — ED Notes (Signed)
Attempted report 

## 2015-07-17 NOTE — ED Notes (Signed)
Pt. Given food and drink with EDP approval.  

## 2015-07-17 NOTE — H&P (Signed)
Date: 07/17/2015               Patient Name:  William Massey MRN: [id number]  DOB: 1986/06/10 Age / Sex: 29 y.o., male   PCP: No primary care provider on file.         Medical Service: Internal Medicine Teaching Service         Attending Physician: Dr. Burns Spain, MD    First Contact: Dr. Gwynn Burly Pager: [phone]  Second Contact: Dr. Rich Number Pager: [phone]       After Hours (After 5p/  First Contact Pager: [phone]  weekends / holidays): Second Contact Pager: [phone]   Chief Complaint: Testicular pain and swelling  History of Present Illness: William Massey is a 29 y.o. 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 Ronne Binning (Urology).  Patient states that he was in his usual state of health until this past Wednesday when he had sudden onset of testicular pain and swelling.  States it became worse, more red over the next couple of days which prompted him to present to the ED for evaluation.  Patient states his testicle feels heavy and fluid filled with pain on the posterior part of his testicle that is exacerbated when sitting down.  States he has tried ibuprofen and ice at home which has provided mild relief.  States that standing does not cause discomfort.  Pain rated as 3/10 currently but as bad as 8-9/10 at its worse.  He reports being febrile off and on at home during this time with fever of 101 at home last night and 100.9 this morning.  States that in July 2015, patient had his left testicle removed due to infection.  States current symptoms do not feel similarly because in 2015 his testicle was much more firm/hard and painful.  Otherwise, patient reports no chest pain, shortness of breath, abdominal pain, urinary complaints, nausea or vomiting.  He does report mild constipation associated with hard stool, diaphoresis, fevers/chills.  Per chart review, in July 2016 patient had left radical  orchiectomy after scrotal ultrasound showed a complex left testicular mass.  CT showed a cystic mass replacing the left testis.  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 The mass was removed intact. No scrotal wall violation per op notes.  In the Emergency Department, patient was febrile to 102.1 with an elevated lactate of 2.3 and leukocytosis of 12.7 (ANC 8.9).  Due to this there was concern for sepsis.  IV antibiotics were started to cover for necrotizing fasciitis given the concern for infection in the scrotum and blood cultures were obtained.  X-ray did not show any obvious evidence of gaseous formation.  Dopplers and ultrasound revealed "diffuse soft tissue edema about the scrotum. The remaining right testis is unremarkable in appearance. No evidence for testicular torsion."  CT of the pelvis with contrast revealed "Subcutaneous edema over the right perineal region extending inferiorly to the scrotum with diffuse scrotal wall edema/skin thickening. Mild subcutaneous edema adjacent to and anterior lateral to the left spermatic cord above the scrotum. Findings may be due to soft tissue infection. No evidence of air within the soft tissues to suggest gas-forming infection and no focal abscess." Urinalysis was unremarkable for UTI and showed >1000 glucose.  Repeat lactic acid in the ED was 1.1.  Meds: Current Facility-Administered Medications  Medication Dose Route Frequency  Provider Last Rate Last Dose  . 0.9 %  sodium chloride infusion   Intravenous Continuous Carly Arlyce Harman, MD      . acetaminophen (TYLENOL) tablet 650 mg  650 mg Oral Q6H PRN Su Hoff, MD       Or  . acetaminophen (TYLENOL) suppository 650 mg  650 mg Rectal Q6H PRN Carly J Rivet, MD      . cefTAZidime (FORTAZ) 2 g in dextrose 5 % 50 mL IVPB  2 g Intravenous 3 times per day Loren Racer, MD      . enoxaparin (LOVENOX) injection 40 mg  40 mg Subcutaneous Q24H  Carly J Rivet, MD      . insulin aspart (novoLOG) injection 0-15 Units  0-15 Units Subcutaneous TID WC Carly J Rivet, MD      . insulin aspart (novoLOG) injection 0-5 Units  0-5 Units Subcutaneous QHS Carly J Rivet, MD      . insulin aspart (novoLOG) injection 6 Units  6 Units Subcutaneous TID WC Carly J Rivet, MD      . insulin glargine (LANTUS) injection 10 Units  10 Units Subcutaneous QHS Carly J Rivet, MD      . ondansetron (ZOFRAN) tablet 4 mg  4 mg Oral Q6H PRN Carly Arlyce Harman, MD       Or  . ondansetron (ZOFRAN) injection 4 mg  4 mg Intravenous Q6H PRN Carly J Rivet, MD      . oxyCODONE (Oxy IR/ROXICODONE) immediate release tablet 5 mg  5 mg Oral Q4H PRN Carly J Rivet, MD      . senna-docusate (Senokot-S) tablet 1 tablet  1 tablet Oral QHS PRN Carly J Rivet, MD      . sodium chloride 0.9 % injection 3 mL  3 mL Intravenous Q12H Carly J Rivet, MD      . vancomycin (VANCOCIN) 1,500 mg in sodium chloride 0.9 % 500 mL IVPB  1,500 mg Intravenous Q8H Loren Racer, MD        Allergies: Allergies as of 07/16/2015  . (No Known Allergies)   Past Medical History  Diagnosis Date  . Asthma   . Diabetes mellitus without complication (HCC)   . Hypertension    Past Surgical History  Procedure Laterality Date  . Orchiectomy Left 02/19/2015    Procedure: RADICAL ORCHIECTOMY;  Surgeon: Malen Gauze, MD;  Location: [facility];  Service: Urology;  Laterality: Left;   No family history on file. Social History   Social History  . Marital Status: Single    Spouse Name: N/A  . Number of Children: N/A  . Years of Education: N/A   Occupational History  . Not on file.   Social History Main Topics  . Smoking status: Never Smoker   . Smokeless tobacco: Never Used  . Alcohol Use: No  . Drug Use: 0.20 per week    Special: Marijuana  . Sexual Activity: Not on file   Other Topics Concern  . Not on file   Social History Narrative    Review of Systems: Pertinent items are noted in  HPI.  Physical Exam: Blood pressure 163/106, pulse 117, temperature 100.6 F (38.1 C), temperature source Oral, resp. rate 17, height  (1.854 m), weight 548 lb 4 oz (248.685 kg), SpO2 94 %. Physical Exam  Constitutional: He is oriented to person, place, and time. He appears well-developed and well-nourished.  Morbidly obese male lying on right side  HENT:  Head: Normocephalic and atraumatic.  Eyes:  Conjunctivae and EOM are normal.  Neck: Normal range of motion.  Cardiovascular: Normal rate and regular rhythm.   Distant heart sounds due to obesity  Pulmonary/Chest: Effort normal and breath sounds normal.  Abdominal:  Abdomen was obese, firm with large umbilical hernia.  No tenderness, positive bowel sounds, no rebound or guarding  Genitourinary:  Left testicle absent s/p orchiectomy. Right testicle erythematous and edematous.  No obvious masses appreciated.  No drainage or signs of Fourniers.  Tender to palpation.  Neurological: He is alert and oriented to person, place, and time. No cranial nerve deficit.  Skin: Skin is warm. He is diaphoretic.  Psychiatric: He has a normal mood and affect.   Lab results: Basic Metabolic Panel:  Recent Labs  16/05/9610/26/16 0116 07/17/15 0125  NA 135 135  K 4.3 4.2  CL 97* 98*  CO2 26  --   GLUCOSE 480* 493*  BUN 12 13  CREATININE 0.84 0.80  CALCIUM 9.4  --    Liver Function Tests: No results for input(s): AST, ALT, ALKPHOS, BILITOT, PROT, ALBUMIN in the last 72 hours. No results for input(s): LIPASE, AMYLASE in the last 72 hours. No results for input(s): AMMONIA in the last 72 hours. CBC:  Recent Labs  07/17/15 0125 07/17/15 0157  WBC  --  12.7*  NEUTROABS  --  8.9*  HGB 17.0 15.0  HCT 50.0 43.9  MCV  --  84.3  PLT  --  186   Cardiac Enzymes: No results for input(s): CKTOTAL, CKMB, CKMBINDEX, TROPONINI in the last 72 hours. BNP: No results for input(s): PROBNP in the last 72 hours. D-Dimer: No results for input(s): DDIMER  in the last 72 hours. CBG:  Recent Labs  07/17/15 0510  GLUCAP 334*   Hemoglobin A1C: No results for input(s): HGBA1C in the last 72 hours. Fasting Lipid Panel: No results for input(s): CHOL, HDL, LDLCALC, TRIG, CHOLHDL, LDLDIRECT in the last 72 hours. Thyroid Function Tests: No results for input(s): TSH, T4TOTAL, FREET4, T3FREE, THYROIDAB in the last 72 hours. Anemia Panel: No results for input(s): VITAMINB12, FOLATE, FERRITIN, TIBC, IRON, RETICCTPCT in the last 72 hours. Coagulation: No results for input(s): LABPROT, INR in the last 72 hours. Urine Drug Screen: Drugs of Abuse  No results found for: LABOPIA, COCAINSCRNUR, LABBENZ, AMPHETMU, THCU, LABBARB  Alcohol Level: No results for input(s): ETH in the last 72 hours. Urinalysis:  Recent Labs  07/16/15 2350  COLORURINE YELLOW  LABSPEC 1.042*  PHURINE 5.5  GLUCOSEU >1000*  HGBUR NEGATIVE  BILIRUBINUR NEGATIVE  KETONESUR NEGATIVE  PROTEINUR NEGATIVE  NITRITE NEGATIVE  LEUKOCYTESUR NEGATIVE    Imaging results:  Dg Pelvis 1-2 Views  07/17/2015  CLINICAL DATA:  Acute onset of scrotal swelling and groin pain. Initial encounter. EXAM: PELVIS - 1-2 VIEW COMPARISON:  None. FINDINGS: There is no evidence of fracture or dislocation. Both femoral heads are seated normally within their respective acetabula. No significant degenerative change is appreciated. The sacroiliac joints are unremarkable in appearance. Slight widening of the pubic symphysis is nonspecific. The visualized bowel gas pattern is grossly unremarkable in appearance. IMPRESSION: No evidence of fracture or dislocation. Electronically Signed   By: Roanna RaiderJeffery  Chang M.D.   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 Continued pain/swelling to right testicle. Four rounds of antibiotics without improvement. Evaluate for necrotizing fasciitis. EXAM: CT PELVIS WITH CONTRAST  TECHNIQUE: Multidetector CT imaging of the pelvis  was performed using the standard protocol following the bolus administration of intravenous contrast. CONTRAST:  OMNIPAQUE IOHEXOL 300 MG/ML  SOLN COMPARISON:  None. FINDINGS: Appendix is normal. The bladder, prostate and rectum are within normal. No evidence of free pelvic fluid. A few small bilateral symmetrical superficial inguinal lymph nodes. There is moderate scrotal wall edema/skin thickening with subcutaneous edema within the right peroneal region. There is also mild subcutaneous edema anterior lateral and adjacent to the left spermatic cord above the scrotum. No air within the soft tissues. No focal abscess visualized. Remaining bones soft tissues are unremarkable. IMPRESSION: Subcutaneous edema over the right perineal region extending inferiorly to the scrotum with diffuse scrotal wall edema/skin thickening. Mild subcutaneous edema adjacent to and anterior lateral to the left spermatic cord above the scrotum. Findings may be due to soft tissue infection. No evidence of air within the soft tissues to suggest gas-forming infection and no focal abscess. Electronically Signed   By: Elberta Fortis M.D.   On: 07/17/2015 07:58   US Scrotum  07/17/2015  CLINICAL DATA:  Acute onset of right-sided testicular pain for 3 days. Initial encounter. EXAM: SCROTAL ULTRASOUND DOPPLER ULTRASOUND OF THE TESTICLES TECHNIQUE: Complete ultrasound examination of the testicles, epididymis, and other scrotal structures was performed. Color and spectral Doppler ultrasound were also utilized to evaluate blood flow to the testicles. COMPARISON:  CT of the abdomen and pelvis performed 02/05/2015 FINDINGS: Right testicle Measurements: 3.8 x 2.7 x 3.8 cm. No mass or microlithiasis visualized. Left testicle Status post resection. Right epididymis:  Normal in size and appearance. Left epididymis:  Status post resection. Hydrocele:  None visualized. Varicocele:  None visualized.  Pulsed Doppler interrogation of both testes demonstrates normal low resistance arterial and venous waveforms bilaterally. Diffuse soft tissue edema is noted about the scrotum, with soft tissue thickening measuring up to 2.4 cm. IMPRESSION: Diffuse soft tissue edema about the scrotum. The remaining right testis is unremarkable in appearance. No evidence for testicular torsion. Electronically Signed   By: Roanna Raider M.D.   On: 07/17/2015 01:03   [place] Art/ven Flow Abd Pelv Doppler  07/17/2015  CLINICAL DATA:  Acute onset of right-sided testicular pain for 3 days. Initial encounter. EXAM: SCROTAL ULTRASOUND DOPPLER ULTRASOUND OF THE TESTICLES TECHNIQUE: Complete ultrasound examination of the testicles, epididymis, and other scrotal structures was performed. Color and spectral Doppler ultrasound were also utilized to evaluate blood flow to the testicles. COMPARISON:  CT of the abdomen and pelvis performed 02/05/2015 FINDINGS: Right testicle Measurements: 3.8 x 2.7 x 3.8 cm. No mass or microlithiasis visualized. Left testicle Status post resection. Right epididymis:  Normal in size and appearance. Left epididymis:  Status post resection. Hydrocele:  None visualized. Varicocele:  None visualized. Pulsed Doppler interrogation of both testes demonstrates normal low resistance arterial and venous waveforms bilaterally. Diffuse soft tissue edema is noted about the scrotum, with soft tissue thickening measuring up to 2.4 cm. IMPRESSION: Diffuse soft tissue edema about the scrotum. The remaining right testis is unremarkable in appearance. No evidence for testicular torsion. Electronically Signed   By: Roanna Raider M.D.   On: 07/17/2015 01:03    Other results: EKG: none  Assessment & Plan by Problem: Principal Problem:   Sepsis (HCC) Active Problems:   Scrotal edema   Type 2 diabetes mellitus (HCC)   Hypertension associated with diabetes (HCC)   Morbid obesity with BMI of 70 and over, adult [facility])  29 y.o.  male with past medical history of DM2, HTN, morbid obesity,  and left radical orchiectomy (July 2016) performed by Dr. Ronne Binning (Urology).  Scrotal Edema: concern for sepsis at presentation with fever, leukocytosis, tachycardia, elevated lactate.  Respirations okay.  Lactate improved with IV fluids.  No evidence of Fournier's on exam (tense edema, blisters/bullae) or imaging.   Concern for epididimytis although urinalysis is not indicative of infection, it could represent chronic, non-infectious epididimytis.  Scrotal cellulitis is on the differential as well.  Do not suspect torsion as testicle is not high riding, no Bell-clapper deformity, and dopplers okay.  Do not suspect inguinal hernia as his pain is in the scrotum not as much groin or abdomen.  No history of trauma. - Urology consult if not improved  - Vancomycin and Ceftazidime per pharmacy - Trend WBC - GC/CT, urine culture - Blood cultures x 2 - Scrotal support - IV fluids - Tylenol prn fever - Oxycodone  q4h prn - Zofran prn  DM2: patient states he is on Metformin at home which he is not very compliant with due to GI upset.  No A1C on file.  Glucose elevated on admission to 493 - Check A1C - Sliding scale insulin - moderate w/ HS coverage plus Novolog 6 units tid w/ meals - Lantus 10 units qhs  HTN - Check EKG  Constipation - Senokot-S 1 tablet qhs prn   Morbid Obesity - Carb modified diet  Health Maintenance - Check HIV  FEN Fluids: NS IV fluids @ 145mL/hr x 12 hours Electrolytes: replete prn Nutrition: Carb modified  DVT PPx: Lovenox  Code: FULL  Dispo: Disposition is deferred at this time, awaiting improvement of current medical problems. Anticipated discharge in approximately 2-3 day(s).   The patient does not have a current PCP (No primary care provider on file.) and does need an [facility] hospital follow-up appointment after discharge.  The patient does not have transportation limitations that hinder  transportation to clinic appointments.  Signed: Gwynn Burly, DO 07/17/2015, 1:02 PM

## 2015-07-17 NOTE — ED Notes (Signed)
Pt and family refusing chest x-ray. MD made aware.

## 2015-07-17 NOTE — ED Notes (Signed)
PA Humes notified about elevated lactic acid

## 2015-07-17 NOTE — ED Provider Notes (Signed)
10:40 AM Patient aware of all results. He remains mildly tachycardic, though states that his pain has diminished. CT scans discussed with him and family members Given concern for sepsis,, though he is improving, with reduced lactic acidosis, diminished pain, and no evidence for Fournier's gangrene, he was admitted for further evaluation and management.  William Munchobert Meg Niemeier, MD 07/17/15 1040

## 2015-07-17 NOTE — ED Notes (Signed)
Pharmacy called r/t Elita QuickFortaz out of stock in [place] , pharmacy aware.

## 2015-07-17 NOTE — Progress Notes (Signed)
ANTIBIOTIC CONSULT NOTE - INITIAL  Pharmacy Consult for Vancomycin, ceftazidime Indication: possible necrotizing fasciitis  No Known Allergies  Patient Measurements: Height: 6\' 1"  (185.4 cm) Weight: (!) 548 lb 4 oz (248.685 kg) IBW/kg (Calculated) : 79.9 Adjusted Body Weight:   Vital Signs: Temp: 102.1 F (38.9 C) (11/26 0111) Temp Source: Oral (11/26 0111) BP: 153/101 mmHg (11/26 0219) Pulse Rate: 116 (11/26 0400) Intake/Output from previous day:   Intake/Output from this shift:    Labs:  Recent Labs  07/17/15 0116 07/17/15 0125 07/17/15 0157  WBC  --   --  12.7*  HGB  --  17.0 15.0  PLT  --   --  186  CREATININE 0.84 0.80  --    Estimated Creatinine Clearance: 278.9 mL/min (by C-G formula based on Cr of 0.8). No results for input(s): VANCOTROUGH, VANCOPEAK, VANCORANDOM, GENTTROUGH, GENTPEAK, GENTRANDOM, TOBRATROUGH, TOBRAPEAK, TOBRARND, AMIKACINPEAK, AMIKACINTROU, AMIKACIN in the last 72 hours.   Microbiology: No results found for this or any previous visit (from the past 720 hour(s)).  Medical History: Past Medical History  Diagnosis Date  . Asthma   . Diabetes mellitus without complication (HCC)   . Hypertension     Medications:  Anti-infectives    Start     Dose/Rate Route Frequency Ordered Stop   07/17/15 1400  vancomycin (VANCOCIN) 1,500 mg in sodium chloride 0.9 % 500 mL IVPB     1,500 mg 250 mL/hr over 120 Minutes Intravenous Every 8 hours 07/17/15 0507     07/17/15 1400  cefTAZidime (FORTAZ) 2 g in dextrose 5 % 50 mL IVPB     2 g 100 mL/hr over 30 Minutes Intravenous 3 times per day 07/17/15 0507     07/17/15 0130  clindamycin (CLEOCIN) IVPB 900 mg     900 mg 100 mL/hr over 30 Minutes Intravenous  Once 07/17/15 0122 07/17/15 0355   07/17/15 0130  cefTAZidime (FORTAZ) 2 g in dextrose 5 % 50 mL IVPB     2 g 100 mL/hr over 30 Minutes Intravenous  Once 07/17/15 0125 07/17/15 0354   07/17/15 0130  vancomycin (VANCOCIN) 2,000 mg in sodium chloride  0.9 % 500 mL IVPB     2,000 mg 250 mL/hr over 120 Minutes Intravenous  Once 07/17/15 0125       Assessment: Patient with possible necrotizing fasciitis.  [facility]  Patient with weight > 200kg.  Goal of Therapy:  Ceftazidime dosed based on patient weight and renal function  Vancomycin trough level 15-20 mcg/ml Appropriate antibiotic dosing for renal function; eradication of infection  Plan:  Measure antibiotic drug levels at steady state Follow up culture results Vancomycin 2gm iv x1, then 1500mg  iv q8hr  Ceftazdime 2gm iv q8hr  Darlina GuysGrimsley Jr, Jacquenette ShoneJulian Crowford 07/17/2015,5:09 AM

## 2015-07-17 NOTE — ED Provider Notes (Signed)
CSN: [id number]     Arrival date & time 07/16/15  2235 History   First MD Initiated Contact with Patient 07/17/15 0037     Chief Complaint  Patient presents with  . Testicle Pain    & swelling     (Consider location/radiation/quality/duration/timing/severity/associated sxs/prior Treatment) HPI Patient presents with scrotal wall pain and redness for the past 4 days. He's had fever, chills at home. He is initially evaluated at [facility]. There he had a elevated temperature, white blood cell count. Ultrasound revealed diffuse scrotal wall thickening without testicular abnormality or evidence of abscess. Was started on broad-spectrum antibiotics. Transferred to [facility] ED for pelvic ultrasound. Patient is overweight and would not physically fit into the CT scanner at [place] [facility]. Past Medical History  Diagnosis Date  . Asthma   . Diabetes mellitus without complication (HCC)   . Hypertension    Past Surgical History  Procedure Laterality Date  . Orchiectomy Left 02/19/2015    Procedure: RADICAL ORCHIECTOMY;  Surgeon: Malen Gauze, MD;  Location: [facility];  Service: Urology;  Laterality: Left;   No family history on file. Social History  Substance Use Topics  . Smoking status: Never Smoker   . Smokeless tobacco: Never Used  . Alcohol Use: No    Review of Systems  Constitutional: Positive for fever, chills and diaphoresis.  Respiratory: Negative for cough and shortness of breath.   Cardiovascular: Negative for chest pain.  Gastrointestinal: Negative for nausea, vomiting, abdominal pain, diarrhea and constipation.  Genitourinary: Positive for scrotal swelling. Negative for dysuria, frequency, hematuria, flank pain, difficulty urinating, penile pain and testicular pain.  Musculoskeletal: Negative for back pain, neck pain and neck stiffness.  Skin: Positive for color change.  Neurological: Negative for dizziness, weakness, light-headedness, numbness and headaches.  All other  systems reviewed and are negative.     Allergies  Review of patient's allergies indicates no known allergies.  Home Medications   Prior to Admission medications   Medication Sig Start Date End Date Taking? Authorizing Provider  ibuprofen (ADVIL,MOTRIN) 200 MG tablet Take 400 mg by mouth every 6 (six) hours as needed (for pain.).   Yes Historical Provider, MD  oxyCODONE-acetaminophen (ROXICET) 5-325 MG per tablet Take 1 tablet by mouth every 4 (four) hours as needed for severe pain. Patient not taking: Reported on 07/16/2015 02/19/15   Malen Gauze, MD  sulfamethoxazole-trimethoprim (BACTRIM DS,SEPTRA DS) 800-160 MG per tablet Take 1 tablet by mouth 2 (two) times daily. Patient not taking: Reported on 07/16/2015 02/19/15   Malen Gauze, MD   BP 143/87 mmHg  Pulse 116  Temp(Src) 99.6 F (37.6 C) (Oral)  Resp 19  Ht  (1.854 m)  Wt 548 lb 4 oz (248.685 kg)  BMI 72.35 kg/m2  SpO2 95% Physical Exam  Constitutional: He is oriented to person, place, and time. He appears well-developed and well-nourished. No distress.  HENT:  Head: Normocephalic and atraumatic.  Mouth/Throat: Oropharynx is clear and moist. No oropharyngeal exudate.  Eyes: EOM are normal. Pupils are equal, round, and reactive to light.  Neck: Normal range of motion. Neck supple.  Cardiovascular: Normal rate and regular rhythm.   Pulmonary/Chest: Effort normal and breath sounds normal. No respiratory distress. He has no wheezes. He has no rales. He exhibits no tenderness.  No respiratory distress.  Abdominal: Soft. Bowel sounds are normal. He exhibits mass (umbilical hernia is nontender though difficult to reduce). He exhibits no distension. There is no tenderness. There is no rebound  and no guarding.  Genitourinary:  Buried penis. Right inferior scrotal wall thickening, erythema. No crepitance. No masses.  Musculoskeletal: Normal range of motion. He exhibits no edema or tenderness.  Neurological: He is  alert and oriented to person, place, and time.  Skin: Skin is warm and dry. No rash noted. There is erythema.  Psychiatric: He has a normal mood and affect. His behavior is normal.  Nursing note and vitals reviewed.   ED Course  Procedures (including critical care time) Labs Review Labs Reviewed  URINALYSIS, ROUTINE W REFLEX MICROSCOPIC (NOT AT [facility]) - Abnormal; Notable for the following:    Specific Gravity, Urine 1.042 (*)    Glucose, UA >1000 (*)    All other components within normal limits  URINE MICROSCOPIC-ADD ON - Abnormal; Notable for the following:    Squamous Epithelial / LPF 0-5 (*)    All other components within normal limits  BASIC METABOLIC PANEL - Abnormal; Notable for the following:    Chloride 97 (*)    Glucose, Bld 480 (*)    All other components within normal limits  CBC WITH DIFFERENTIAL/PLATELET - Abnormal; Notable for the following:    WBC 12.7 (*)    Neutro Abs 8.9 (*)    Monocytes Absolute 1.4 (*)    All other components within normal limits  I-STAT CG4 LACTIC ACID, ED - Abnormal; Notable for the following:    Lactic Acid, Venous 2.34 (*)    All other components within normal limits  I-STAT CHEM 8, ED - Abnormal; Notable for the following:    Chloride 98 (*)    Glucose, Bld 493 (*)    Calcium, Ion 1.08 (*)    All other components within normal limits  CBG MONITORING, ED - Abnormal; Notable for the following:    Glucose-Capillary 334 (*)    All other components within normal limits  CULTURE, BLOOD (ROUTINE X 2)  CULTURE, BLOOD (ROUTINE X 2)  URINE CULTURE  CBC WITH DIFFERENTIAL/PLATELET  I-STAT CG4 LACTIC ACID, ED    Imaging Review Dg Pelvis 1-2 Views  07/17/2015  CLINICAL DATA:  Acute onset of scrotal swelling and groin pain. Initial encounter. EXAM: PELVIS - 1-2 VIEW COMPARISON:  None. FINDINGS: There is no evidence of fracture or dislocation. Both femoral heads are seated normally within their respective acetabula. No significant degenerative  change is appreciated. The sacroiliac joints are unremarkable in appearance. Slight widening of the pubic symphysis is nonspecific. The visualized bowel gas pattern is grossly unremarkable in appearance. IMPRESSION: No evidence of fracture or dislocation. Electronically Signed   By: Roanna RaiderJeffery  Chang M.D.   On: 07/17/2015 01:48   [place] Scrotum  07/17/2015  CLINICAL DATA:  Acute onset of right-sided testicular pain for 3 days. Initial encounter. EXAM: SCROTAL ULTRASOUND DOPPLER ULTRASOUND OF THE TESTICLES TECHNIQUE: Complete ultrasound examination of the testicles, epididymis, and other scrotal structures was performed. Color and spectral Doppler ultrasound were also utilized to evaluate blood flow to the testicles. COMPARISON:  CT of the abdomen and pelvis performed 02/05/2015 FINDINGS: Right testicle Measurements: 3.8 x 2.7 x 3.8 cm. No mass or microlithiasis visualized. Left testicle Status post resection. Right epididymis:  Normal in size and appearance. Left epididymis:  Status post resection. Hydrocele:  None visualized. Varicocele:  None visualized. Pulsed Doppler interrogation of both testes demonstrates normal low resistance arterial and venous waveforms bilaterally. Diffuse soft tissue edema is noted about the scrotum, with soft tissue thickening measuring up to 2.4 cm. IMPRESSION: Diffuse soft tissue edema about  the scrotum. The remaining right testis is unremarkable in appearance. No evidence for testicular torsion. Electronically Signed   By: Roanna Raider M.D.   On: 07/17/2015 01:03   [place] Art/ven Flow Abd Pelv Doppler  07/17/2015  CLINICAL DATA:  Acute onset of right-sided testicular pain for 3 days. Initial encounter. EXAM: SCROTAL ULTRASOUND DOPPLER ULTRASOUND OF THE TESTICLES TECHNIQUE: Complete ultrasound examination of the testicles, epididymis, and other scrotal structures was performed. Color and spectral Doppler ultrasound were also utilized to evaluate blood flow to the testicles. COMPARISON:   CT of the abdomen and pelvis performed 02/05/2015 FINDINGS: Right testicle Measurements: 3.8 x 2.7 x 3.8 cm. No mass or microlithiasis visualized. Left testicle Status post resection. Right epididymis:  Normal in size and appearance. Left epididymis:  Status post resection. Hydrocele:  None visualized. Varicocele:  None visualized. Pulsed Doppler interrogation of both testes demonstrates normal low resistance arterial and venous waveforms bilaterally. Diffuse soft tissue edema is noted about the scrotum, with soft tissue thickening measuring up to 2.4 cm. IMPRESSION: Diffuse soft tissue edema about the scrotum. The remaining right testis is unremarkable in appearance. No evidence for testicular torsion. Electronically Signed   By: Roanna Raider M.D.   On: 07/17/2015 01:03   I have personally reviewed and evaluated these images and lab results as part of my medical decision-making.   EKG Interpretation None      MDM   Final diagnoses:  Scrotal pain  Scrotal swelling  Sepsis, due to unspecified organism [facility])   Patient is already received broad-spectrum antibiotics and IV fluids. He is currently awaiting CT scan. Oncoming emergency physician pending imaging.      Loren Racer, MD 07/17/15 2308

## 2015-07-17 NOTE — ED Provider Notes (Signed)
CSN: [id number]     Arrival date & time 07/16/15  2235 History   First MD Initiated Contact with Patient 07/17/15 0037     Chief Complaint  Patient presents with  . Testicle Pain    & swelling     (Consider location/radiation/quality/duration/timing/severity/associated sxs/prior Treatment) HPI Comments: 29 year old male with a history of asthma, diabetes mellitus, and hypertension presents to the emergency department for evaluation of right testicular pain and swelling. Patient states that he noted some pain 2 days ago. Pain has been worsening since this time. Patient also states, "I felt a hard spot on the inside". He reports taking Advil for symptoms. Patient last took Motrin at 8 PM. He has had a fever of 100.52F at home. He also reports chills and feeling as though he is "radiating heat". Patient denies nausea, vomiting, abdominal pain, dysuria, or difficulty urinating. He has a history of similar symptoms back in July 2016. 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 Patient had a radical orchiectomy by Dr. Thea Silversmith of [facility] following this diagnosis.  Patient is a 29 y.o. male presenting with testicular pain. The history is provided by the patient. No language interpreter was used.  Testicle Pain Associated symptoms include a fever. Pertinent negatives include no abdominal pain, nausea or vomiting.    Past Medical History  Diagnosis Date  . Asthma   . Diabetes mellitus without complication (HCC)   . Hypertension    Past Surgical History  Procedure Laterality Date  . Orchiectomy Left 02/19/2015    Procedure: RADICAL ORCHIECTOMY;  Surgeon: Malen Gauze, MD;  Location: [facility];  Service: Urology;  Laterality: Left;   No family history on file. Social History  Substance Use Topics  . Smoking status: Never Smoker   . Smokeless tobacco: Never Used  . Alcohol Use: No    Review of Systems  Constitutional:  Positive for fever.  Gastrointestinal: Negative for nausea, vomiting and abdominal pain.  Genitourinary: Positive for scrotal swelling and testicular pain. Negative for dysuria and difficulty urinating.  All other systems reviewed and are negative.   Allergies  Review of patient's allergies indicates no known allergies.  Home Medications   Prior to Admission medications   Medication Sig Start Date End Date Taking? Authorizing Provider  ibuprofen (ADVIL,MOTRIN) 200 MG tablet Take 400 mg by mouth every 6 (six) hours as needed (for pain.).   Yes Historical Provider, MD  oxyCODONE-acetaminophen (ROXICET) 5-325 MG per tablet Take 1 tablet by mouth every 4 (four) hours as needed for severe pain. Patient not taking: Reported on 07/16/2015 02/19/15   Malen Gauze, MD  sulfamethoxazole-trimethoprim (BACTRIM DS,SEPTRA DS) 800-160 MG per tablet Take 1 tablet by mouth 2 (two) times daily. Patient not taking: Reported on 07/16/2015 02/19/15   Malen Gauze, MD   BP 153/101 mmHg  Pulse 121  Temp(Src) 102.1 F (38.9 C) (Oral)  Resp 16  Ht  (1.854 m)  Wt 248.685 kg  BMI 72.35 kg/m2  SpO2 90%   Physical Exam  Constitutional: He is oriented to person, place, and time. He appears well-developed and well-nourished. No distress.  Nontoxic-appearing  HENT:  Head: Normocephalic and atraumatic.  Eyes: Conjunctivae and EOM are normal. No scleral icterus.  Neck: Normal range of motion.  Cardiovascular: Regular rhythm and intact distal pulses.  Tachycardia present.   Pulmonary/Chest: Effort normal. No respiratory distress. He has no wheezes.  Respirations  even and unlabored  Genitourinary: Right testis shows mass, swelling and tenderness. Right testis is descended.     Musculoskeletal: Normal range of motion.  Neurological: He is alert and oriented to person, place, and time. He exhibits normal muscle tone. Coordination normal.  GCS 15. Speech is goal oriented.  Skin: Skin is warm and  dry. No rash noted. He is not diaphoretic. No erythema. No pallor.  Psychiatric: He has a normal mood and affect. His behavior is normal.  Nursing note and vitals reviewed.   ED Course  Procedures (including critical care time) Labs Review Labs Reviewed  URINALYSIS, ROUTINE W REFLEX MICROSCOPIC (NOT AT [facility]) - Abnormal; Notable for the following:    Specific Gravity, Urine 1.042 (*)    Glucose, UA >1000 (*)    All other components within normal limits  URINE MICROSCOPIC-ADD ON - Abnormal; Notable for the following:    Squamous Epithelial / LPF 0-5 (*)    All other components within normal limits  BASIC METABOLIC PANEL - Abnormal; Notable for the following:    Chloride 97 (*)    Glucose, Bld 480 (*)    All other components within normal limits  CBC WITH DIFFERENTIAL/PLATELET - Abnormal; Notable for the following:    WBC 12.7 (*)    Neutro Abs 8.9 (*)    Monocytes Absolute 1.4 (*)    All other components within normal limits  I-STAT CG4 LACTIC ACID, ED - Abnormal; Notable for the following:    Lactic Acid, Venous 2.34 (*)    All other components within normal limits  I-STAT CHEM 8, ED - Abnormal; Notable for the following:    Chloride 98 (*)    Glucose, Bld 493 (*)    Calcium, Ion 1.08 (*)    All other components within normal limits  CULTURE, BLOOD (ROUTINE X 2)  CULTURE, BLOOD (ROUTINE X 2)  URINE CULTURE  CBC WITH DIFFERENTIAL/PLATELET  I-STAT CG4 LACTIC ACID, ED    Imaging Review Dg Pelvis 1-2 Views  07/17/2015  CLINICAL DATA:  Acute onset of scrotal swelling and groin pain. Initial encounter. EXAM: PELVIS - 1-2 VIEW COMPARISON:  None. FINDINGS: There is no evidence of fracture or dislocation. Both femoral heads are seated normally within their respective acetabula. No significant degenerative change is appreciated. The sacroiliac joints are unremarkable in appearance. Slight widening of the pubic symphysis is nonspecific. The visualized bowel gas pattern is grossly  unremarkable in appearance. IMPRESSION: No evidence of fracture or dislocation. Electronically Signed   By: Roanna Raider M.D.   On: 07/17/2015 01:48   US Scrotum  07/17/2015  CLINICAL DATA:  Acute onset of right-sided testicular pain for 3 days. Initial encounter. EXAM: SCROTAL ULTRASOUND DOPPLER ULTRASOUND OF THE TESTICLES TECHNIQUE: Complete ultrasound examination of the testicles, epididymis, and other scrotal structures was performed. Color and spectral Doppler ultrasound were also utilized to evaluate blood flow to the testicles. COMPARISON:  CT of the abdomen and pelvis performed 02/05/2015 FINDINGS: Right testicle Measurements: 3.8 x 2.7 x 3.8 cm. No mass or microlithiasis visualized. Left testicle Status post resection. Right epididymis:  Normal in size and appearance. Left epididymis:  Status post resection. Hydrocele:  None visualized. Varicocele:  None visualized. Pulsed Doppler interrogation of both testes demonstrates normal low resistance arterial and venous waveforms bilaterally. Diffuse soft tissue edema is noted about the scrotum, with soft tissue thickening measuring up to 2.4 cm. IMPRESSION: Diffuse soft tissue edema about the scrotum. The remaining right testis is unremarkable in appearance. No  evidence for testicular torsion. Electronically Signed   By: Roanna RaiderJeffery  Chang M.D.   On: 07/17/2015 01:03   [place] Art/ven Flow Abd Pelv Doppler  07/17/2015  CLINICAL DATA:  Acute onset of right-sided testicular pain for 3 days. Initial encounter. EXAM: SCROTAL ULTRASOUND DOPPLER ULTRASOUND OF THE TESTICLES TECHNIQUE: Complete ultrasound examination of the testicles, epididymis, and other scrotal structures was performed. Color and spectral Doppler ultrasound were also utilized to evaluate blood flow to the testicles. COMPARISON:  CT of the abdomen and pelvis performed 02/05/2015 FINDINGS: Right testicle Measurements: 3.8 x 2.7 x 3.8 cm. No mass or microlithiasis visualized. Left testicle Status post  resection. Right epididymis:  Normal in size and appearance. Left epididymis:  Status post resection. Hydrocele:  None visualized. Varicocele:  None visualized. Pulsed Doppler interrogation of both testes demonstrates normal low resistance arterial and venous waveforms bilaterally. Diffuse soft tissue edema is noted about the scrotum, with soft tissue thickening measuring up to 2.4 cm. IMPRESSION: Diffuse soft tissue edema about the scrotum. The remaining right testis is unremarkable in appearance. No evidence for testicular torsion. Electronically Signed   By: Roanna RaiderJeffery  Chang M.D.   On: 07/17/2015 01:03   I have personally reviewed and evaluated these images and lab results as part of my medical decision-making.   EKG Interpretation None      MDM   Final diagnoses:  Scrotal pain  Scrotal swelling  Sepsis, due to unspecified organism [facility](HCC)    29 year old male with history of diabetes comes into the emergency department for evaluation of right sided scrotal pain and swelling. He has a history of radical orchiectomy in July 2016 after a complex cystic structure was found on his left testicle. Patient is febrile with an elevated lactate and leukocytosis. He meets sepsis criteria. IV antibiotics initiated to cover for necrotizing fasciitis given concern for infectious process in the scrotum. X-ray does not show any obvious evidence of gaseous formation. There is diffuse soft tissue edema about the scrotum noted on ultrasound.  [facility] with IV contrast. We are unable to obtain any imaging currently as the patient will not fit [facility] CT [facility]. Case discussed with Dr. Ranae PalmsYelverton who will accept the patient in transfer. Dr. Thea SilversmithMackenzie of [facility] has been notified that the patient's workup is pending.   Filed Vitals:   07/16/15 2309 07/17/15 0111 07/17/15 0201 07/17/15 0219  BP: 171/120 178/127  153/101  Pulse: 120 126  121   Temp: 99.6 F (37.6 C) 102.1 F (38.9 C)    TempSrc: Oral Oral    Resp: 16 18  16   Height:   6\' 1"  (1.854 m)   Weight:   248.685 kg   SpO2: 93% 93%  90%     Antony MaduraKelly Addylin Manke, PA-C 07/17/15 0304  Derwood KaplanAnkit Nanavati, MD 07/17/15 2305

## 2015-07-17 NOTE — ED Notes (Signed)
Pt returned from CT at this time, per transport hasn't had image yet. Will return after six

## 2015-07-17 NOTE — ED Notes (Signed)
Patient transported to X-ray 

## 2015-07-17 NOTE — ED Notes (Signed)
Pt. Not able to get O2 sats up after exertion. Put back on 2L [place].

## 2015-07-17 NOTE — ED Notes (Addendum)
Dr. Ranae PalmsYelverton and family bedside.

## 2015-07-17 NOTE — ED Notes (Signed)
Attempted report. Status time 31 minutes.

## 2015-07-18 DIAGNOSIS — N5082 Scrotal pain: Secondary | ICD-10-CM | POA: Insufficient documentation

## 2015-07-18 DIAGNOSIS — B9689 Other specified bacterial agents as the cause of diseases classified elsewhere: Secondary | ICD-10-CM

## 2015-07-18 LAB — CBC
HCT: 39.2 % (ref 39.0–52.0)
Hemoglobin: 12.5 g/dL — ABNORMAL LOW (ref 13.0–17.0)
MCH: 27.5 pg (ref 26.0–34.0)
MCHC: 31.9 g/dL (ref 30.0–36.0)
MCV: 86.3 fL (ref 78.0–100.0)
PLATELETS: 163 10*3/uL (ref 150–400)
RBC: 4.54 MIL/uL (ref 4.22–5.81)
RDW: 13.9 % (ref 11.5–15.5)
WBC: 9.2 10*3/uL (ref 4.0–10.5)

## 2015-07-18 LAB — COMPREHENSIVE METABOLIC PANEL
ALT: 21 U/L (ref 17–63)
ANION GAP: 11 (ref 5–15)
AST: 15 U/L (ref 15–41)
Albumin: 2.7 g/dL — ABNORMAL LOW (ref 3.5–5.0)
Alkaline Phosphatase: 55 U/L (ref 38–126)
BUN: 7 mg/dL (ref 6–20)
CHLORIDE: 100 mmol/L — AB (ref 101–111)
CO2: 25 mmol/L (ref 22–32)
Calcium: 8.3 mg/dL — ABNORMAL LOW (ref 8.9–10.3)
Creatinine, Ser: 0.67 mg/dL (ref 0.61–1.24)
Glucose, Bld: 246 mg/dL — ABNORMAL HIGH (ref 65–99)
Potassium: 3.6 mmol/L (ref 3.5–5.1)
SODIUM: 136 mmol/L (ref 135–145)
Total Bilirubin: 0.8 mg/dL (ref 0.3–1.2)
Total Protein: 6.2 g/dL — ABNORMAL LOW (ref 6.5–8.1)

## 2015-07-18 LAB — GLUCOSE, CAPILLARY
GLUCOSE-CAPILLARY: 240 mg/dL — AB (ref 65–99)
Glucose-Capillary: 219 mg/dL — ABNORMAL HIGH (ref 65–99)
Glucose-Capillary: 249 mg/dL — ABNORMAL HIGH (ref 65–99)
Glucose-Capillary: 257 mg/dL — ABNORMAL HIGH (ref 65–99)

## 2015-07-18 LAB — HIV ANTIBODY (ROUTINE TESTING W REFLEX): HIV Screen 4th Generation wRfx: NONREACTIVE

## 2015-07-18 MED ORDER — SODIUM CHLORIDE 0.9 % IV SOLN
INTRAVENOUS | Status: AC
  Administered 2015-07-18: 13:00:00 via INTRAVENOUS

## 2015-07-18 MED ORDER — PNEUMOCOCCAL VAC POLYVALENT 25 MCG/0.5ML IJ INJ
0.5000 mL | INJECTION | INTRAMUSCULAR | Status: AC
Start: 2015-07-19 — End: 2015-07-19
  Administered 2015-07-19: 0.5 mL via INTRAMUSCULAR
  Filled 2015-07-18: qty 0.5

## 2015-07-18 MED ORDER — INSULIN GLARGINE 100 UNIT/ML ~~LOC~~ SOLN
15.0000 [IU] | Freq: Every day | SUBCUTANEOUS | Status: DC
  Administered 2015-07-18: 15 [IU] via SUBCUTANEOUS
  Filled 2015-07-18 (×2): qty 0.15

## 2015-07-18 MED ORDER — INFLUENZA VAC SPLIT QUAD 0.5 ML IM SUSY
0.5000 mL | PREFILLED_SYRINGE | INTRAMUSCULAR | Status: AC
  Administered 2015-07-19: 0.5 mL via INTRAMUSCULAR

## 2015-07-18 NOTE — Progress Notes (Signed)
  Date: 07/18/2015  Patient name: William Massey  Medical record number: [id number]  Date of birth: 08-05-1986   I have seen and evaluated William Massey and discussed their care with the Residency Team. [name] is a 29 yo admitted for scrotal cellulitis. 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 He returned to his normal state of health. About 3 days prior to admit, he had sudden onset of R testicular pain and swelling that progressed and lead to admission. Imaging has not shown gas form'n in skin, sub Q tissue, or muscle. He was started on appropriate ABX. This AM, he might have slight improvement but the scrotum is still edematous, erythematous, and tender.  Soc Hx : He has not seen his PCP for about 8 months.  Filed Vitals:   07/17/15 1438 07/17/15 2049  BP: 152/93 158/87  Pulse: 108 109  Temp: 101.9 F (38.8 C) 99.5 F (37.5 C)  Resp: 15 15   Gen Obese NAD LE : trace LE edema B +2 DP pulses ABD : Umbilical hernia Scrotum : enlarged, erythematous, no skin breakdown, no drainage. No candida in groin skin fold.  WBC 12.7 - 9.2 LA on admit 2.34  Assessment and Plan: I have seen and evaluated the patient as outlined above. I agree with the formulated Assessment and Plan as detailed in the residents' admission note, with the following changes:   1. Scrotal cellulitis - His RF are obesity and DM. Due to lack of sig improvement, urology will be consulted as surgical exploration is only way to R/O necrotizing infxn. Cont ABX and follow cxs.   2. DM II, control unknown, presumed poor - his A1C is pending but expect to be high since not taking home metformin and not seen PCP for almost a year. His CBG's are high but that could be 2/2 infxn. SSI while in hospital, F/U A1C, and follow CBG's.   3. Obesity - will need intensive outpt mgmt as 70.  Burns SpainElizabeth A Butcher, MD 11/27/201610:09 AM

## 2015-07-18 NOTE — Progress Notes (Signed)
Subjective: Patient seen and examined this morning.  No acute events overnight.  Pain is well controlled and patient endorses slight improvement.  Still having swelling, tenderness of his testicle.  Objective: Vital signs in last 24 hours: Filed Vitals:   07/17/15 1045 07/17/15 1253 07/17/15 1438 07/17/15 2049  BP: 129/69 163/106 152/93 158/87  Pulse: 111 117 108 109  Temp:  100.6 F (38.1 C) 101.9 F (38.8 C) 99.5 F (37.5 C)  TempSrc:  Oral Oral Axillary  Resp: Height:      Weight:      SpO2: 92% 94% 93% 94%   Weight change:   Intake/Output Summary (Last 24 hours) at 07/18/15 1115 Last data filed at 07/18/15 0540  Gross per 24 hour  Intake   3805 ml  Output    900 ml  Net   2905 ml   General: resting in bed, no distress HEENT: EOMI, no scleral icterus Cardiac: RRR, heart sounds distant due to habitus Pulm: clear to auscultation bilaterally, moving normal volumes of air Abd: obese, non-tender, large umbilical hernia Ext: warm and well perfused, trace pedal edema Scrotum: tender, edematous, erythematous, no drainage, no candida Neuro: alert and oriented X3, no focal deficits  Lab Results: Basic Metabolic Panel:  Recent Labs Lab 07/17/15 0116 07/17/15 0125 07/18/15 0440  NA 135 135 136  K 4.3 4.2 3.6  CL 97* 98* 100*  CO2 26  --  25  GLUCOSE 480* 493* 246*  BUN CREATININE 0.84 0.80 0.67  CALCIUM 9.4  --  8.3*   Liver Function Tests:  Recent Labs Lab 07/18/15 0440  AST 15  ALT 21  ALKPHOS 55  BILITOT 0.8  PROT 6.2*  ALBUMIN 2.7*   No results for input(s): LIPASE, AMYLASE in the last 168 hours. No results for input(s): AMMONIA in the last 168 hours. CBC:  Recent Labs Lab 07/17/15 0157 07/18/15 0440  WBC 12.7* 9.2  NEUTROABS 8.9*  --   HGB 15.0 12.5*  HCT 43.9 39.2  MCV 84.3 86.3  PLT 186 163   Cardiac Enzymes: No results for input(s): CKTOTAL, CKMB, CKMBINDEX, TROPONINI in the last 168 hours. BNP: No results  for input(s): PROBNP in the last 168 hours. D-Dimer: No results for input(s): DDIMER in the last 168 hours. CBG:  Recent Labs Lab 07/17/15 0510 07/17/15 1641 07/17/15 2156 07/18/15 0818  GLUCAP 334* 329* 250* 219*   Hemoglobin A1C: No results for input(s): HGBA1C in the last 168 hours. Fasting Lipid Panel: No results for input(s): CHOL, HDL, LDLCALC, TRIG, CHOLHDL, LDLDIRECT in the last 168 hours. Thyroid Function Tests: No results for input(s): TSH, T4TOTAL, FREET4, T3FREE, THYROIDAB in the last 168 hours. Coagulation: No results for input(s): LABPROT, INR in the last 168 hours. Anemia Panel: No results for input(s): VITAMINB12, FOLATE, FERRITIN, TIBC, IRON, RETICCTPCT in the last 168 hours. Urine Drug Screen: Drugs of Abuse  No results found for: LABOPIA, COCAINSCRNUR, LABBENZ, AMPHETMU, THCU, LABBARB  Alcohol Level: No results for input(s): ETH in the last 168 hours. Urinalysis:  Recent Labs Lab 07/16/15 2350  COLORURINE YELLOW  LABSPEC 1.042*  PHURINE 5.5  GLUCOSEU >1000*  HGBUR NEGATIVE  BILIRUBINUR NEGATIVE  KETONESUR NEGATIVE  PROTEINUR NEGATIVE  NITRITE NEGATIVE  LEUKOCYTESUR NEGATIVE    Micro Results: Recent Results (from the past 240 hour(s))  Culture, blood (routine x 2)     Status: None (Preliminary result)   Collection Time: 07/17/15  1:09 AM  Result  Value Ref Range Status   Specimen Description BLOOD LEFT ARM  Final   Special Requests BOTTLES DRAWN AEROBIC ONLY  Final   Culture   Final    NO GROWTH 1 DAY Performed at [facility]    Report Status PENDING  Incomplete  Culture, blood (routine x 2)     Status: None (Preliminary result)   Collection Time: 07/17/15  1:16 AM  Result Value Ref Range Status   Specimen Description BLOOD RIGHT HAND  Final   Special Requests BOTTLES DRAWN AEROBIC AND ANAEROBIC  Final   Culture   Final    NO GROWTH 1 DAY Performed at [facility]    Report Status PENDING  Incomplete    Studies/Results: Dg Pelvis 1-2 Views  07/17/2015  CLINICAL DATA:  Acute onset of scrotal swelling and groin pain. Initial encounter. EXAM: PELVIS - 1-2 VIEW COMPARISON:  None. FINDINGS: There is no evidence of fracture or dislocation. Both femoral heads are seated normally within their respective acetabula. No significant degenerative change is appreciated. The sacroiliac joints are unremarkable in appearance. Slight widening of the pubic symphysis is nonspecific. The visualized bowel gas pattern is grossly unremarkable in appearance. IMPRESSION: No evidence of fracture or dislocation. Electronically Signed   By: Roanna Raider M.D.   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 Continued pain/swelling to right testicle. Four rounds of antibiotics without improvement. Evaluate for necrotizing fasciitis. EXAM: CT PELVIS WITH CONTRAST TECHNIQUE: Multidetector CT imaging of the pelvis was performed using the standard protocol following the bolus administration of intravenous contrast. CONTRAST:  OMNIPAQUE IOHEXOL 300 MG/ML  SOLN COMPARISON:  None. FINDINGS: Appendix is normal. The bladder, prostate and rectum are within normal. No evidence of free pelvic fluid. A few small bilateral symmetrical superficial inguinal lymph nodes. There is moderate scrotal wall edema/skin thickening with subcutaneous edema within the right peroneal region. There is also mild subcutaneous edema anterior lateral and adjacent to the left spermatic cord above the scrotum. No air within the soft tissues. No focal abscess visualized. Remaining bones soft tissues are unremarkable. IMPRESSION: Subcutaneous edema over the right perineal region extending inferiorly to the scrotum with diffuse scrotal wall edema/skin thickening. Mild subcutaneous edema adjacent to and anterior lateral to the left spermatic cord above the scrotum. Findings may be  due to soft tissue infection. No evidence of air within the soft tissues to suggest gas-forming infection and no focal abscess. Electronically Signed   By: Elberta Fortis M.D.   On: 07/17/2015 07:58   US Scrotum  07/17/2015  CLINICAL DATA:  Acute onset of right-sided testicular pain for 3 days. Initial encounter. EXAM: SCROTAL ULTRASOUND DOPPLER ULTRASOUND OF THE TESTICLES TECHNIQUE: Complete ultrasound examination of the testicles, epididymis, and other scrotal structures was performed. Color and spectral Doppler ultrasound were also utilized to evaluate blood flow to the testicles. COMPARISON:  CT of the abdomen and pelvis performed 02/05/2015 FINDINGS: Right testicle Measurements: 3.8 x 2.7 x 3.8 cm. No mass or microlithiasis visualized. Left testicle Status post resection. Right epididymis:  Normal in size and appearance. Left epididymis:  Status post resection. Hydrocele:  None visualized. Varicocele:  None visualized. Pulsed Doppler interrogation of both testes demonstrates normal low resistance arterial and venous waveforms bilaterally. Diffuse soft tissue edema is noted about the scrotum, with soft tissue thickening measuring up to 2.4 cm. IMPRESSION: Diffuse soft tissue edema  about the scrotum. The remaining right testis is unremarkable in appearance. No evidence for testicular torsion. Electronically Signed   By: Roanna Raider M.D.   On: 07/17/2015 01:03   [place] Art/ven Flow Abd Pelv Doppler  07/17/2015  CLINICAL DATA:  Acute onset of right-sided testicular pain for 3 days. Initial encounter. EXAM: SCROTAL ULTRASOUND DOPPLER ULTRASOUND OF THE TESTICLES TECHNIQUE: Complete ultrasound examination of the testicles, epididymis, and other scrotal structures was performed. Color and spectral Doppler ultrasound were also utilized to evaluate blood flow to the testicles. COMPARISON:  CT of the abdomen and pelvis performed 02/05/2015 FINDINGS: Right testicle Measurements: 3.8 x 2.7 x 3.8 cm. No mass or  microlithiasis visualized. Left testicle Status post resection. Right epididymis:  Normal in size and appearance. Left epididymis:  Status post resection. Hydrocele:  None visualized. Varicocele:  None visualized. Pulsed Doppler interrogation of both testes demonstrates normal low resistance arterial and venous waveforms bilaterally. Diffuse soft tissue edema is noted about the scrotum, with soft tissue thickening measuring up to 2.4 cm. IMPRESSION: Diffuse soft tissue edema about the scrotum. The remaining right testis is unremarkable in appearance. No evidence for testicular torsion. Electronically Signed   By: Roanna Raider M.D.   On: 07/17/2015 01:03   Medications:  Scheduled Meds: . cefTAZidime (FORTAZ)  IV  2 g Intravenous 3 times per day  . enoxaparin (LOVENOX) injection  120 mg Subcutaneous Q24H  . [START ON 07/19/2015] Influenza vac split quadrivalent PF  0.5 mL Intramuscular Tomorrow-1000  . insulin aspart  0-15 Units Subcutaneous TID WC  . insulin aspart  0-5 Units Subcutaneous QHS  . insulin aspart  6 Units Subcutaneous TID WC  . insulin glargine  10 Units Subcutaneous QHS  . [START ON 07/19/2015] pneumococcal 23 valent vaccine  0.5 mL Intramuscular Tomorrow-1000  . sodium chloride  3 mL Intravenous Q12H  . vancomycin  1,500 mg Intravenous Q8H   Continuous Infusions:  PRN Meds:.acetaminophen **OR** acetaminophen, morphine injection, ondansetron **OR** ondansetron (ZOFRAN) IV, oxyCODONE, senna-docusate Assessment/Plan: Principal Problem:   Sepsis (HCC) Active Problems:   Scrotal edema   Type 2 diabetes mellitus (HCC)   Hypertension associated with diabetes (HCC)   Morbid obesity with BMI of 70 and over, adult (HCC)  Scrotal Swelling:  Concerning for cellulitis with risk factors of DM2 and morbid obesity.  - Urology consult given lack of significant improvement - Continue Vanc and Ceftaz per pharmacy - WBC trending down to 9.2 from 12.7 - GC/CT pending - Blood cx NG 1 day -  Urine cx pending - Scrotal support, ice - NSAIDs - Tylenol prn - Zofran prn - Oxycodone prn pain  DM2: patient states he is on Metformin at home which he is not very compliant with due to GI upset. No A1C on file. Glucose elevated on admission to 493 - A1C pending - Sliding scale insulin - moderate w/ HS coverage plus Novolog 6 units tid w/ meals - Lantus 10 units qhs  HTN: patient states he is on fluid pill but unsure which - Check EKG - Continue to monitor   Constipation - Senokot-S 1 tablet qhs prn  Morbid Obesity - Carb modified diet  Health Maintenance - HIV pending  FEN Fluids: none Electrolytes: replete prn Nutrition: Carb modified  DVT PPx: Lovenox  Code: FULL  Dispo: Disposition is deferred at this time, awaiting improvement of current medical problems.  Anticipated discharge in approximately 2-3 day(s).   The patient does have a current PCP (No primary care provider on  file.) and [facility].  The patient does not have transportation limitations that hinder transportation to clinic appointments.  .Services Needed at time of discharge: Y = Yes, Blank = No PT:   OT:   RN:   Equipment:   Other:     LOS: 1 day   Gwynn BurlyAndrew Nakeda Lebron, DO 07/18/2015, 11:15 AM

## 2015-07-19 LAB — GC/CHLAMYDIA PROBE AMP (~~LOC~~) NOT AT ARMC
Chlamydia: NEGATIVE
Chlamydia: NEGATIVE
NEISSERIA GONORRHEA: NEGATIVE
Neisseria Gonorrhea: NEGATIVE

## 2015-07-19 LAB — URINE CULTURE

## 2015-07-19 LAB — CBC WITH DIFFERENTIAL/PLATELET
BASOS ABS: 0 10*3/uL (ref 0.0–0.1)
Basophils Relative: 0 %
EOS PCT: 2 %
Eosinophils Absolute: 0.2 10*3/uL (ref 0.0–0.7)
HEMATOCRIT: 39.5 % (ref 39.0–52.0)
Hemoglobin: 13.1 g/dL (ref 13.0–17.0)
LYMPHS ABS: 1.8 10*3/uL (ref 0.7–4.0)
LYMPHS PCT: 20 %
MCH: 28.2 pg (ref 26.0–34.0)
MCHC: 33.2 g/dL (ref 30.0–36.0)
MCV: 85.1 fL (ref 78.0–100.0)
MONO ABS: 1.2 10*3/uL — AB (ref 0.1–1.0)
MONOS PCT: 13 %
Neutro Abs: 5.9 10*3/uL (ref 1.7–7.7)
Neutrophils Relative %: 65 %
PLATELETS: 184 10*3/uL (ref 150–400)
RBC: 4.64 MIL/uL (ref 4.22–5.81)
RDW: 13.8 % (ref 11.5–15.5)
WBC: 9.1 10*3/uL (ref 4.0–10.5)

## 2015-07-19 LAB — BASIC METABOLIC PANEL
Anion gap: 9 (ref 5–15)
BUN: 6 mg/dL (ref 6–20)
CO2: 27 mmol/L (ref 22–32)
Calcium: 8.5 mg/dL — ABNORMAL LOW (ref 8.9–10.3)
Chloride: 101 mmol/L (ref 101–111)
Creatinine, Ser: 0.66 mg/dL (ref 0.61–1.24)
GFR calc Af Amer: >60 mL/min (ref >60)
GLUCOSE: 233 mg/dL — AB (ref 65–99)
POTASSIUM: 3.4 mmol/L — AB (ref 3.5–5.1)
Sodium: 137 mmol/L (ref 135–145)

## 2015-07-19 LAB — HEMOGLOBIN A1C
HEMOGLOBIN A1C: 10.6 % — AB (ref 4.8–5.6)
Mean Plasma Glucose: 258 mg/dL

## 2015-07-19 LAB — GLUCOSE, CAPILLARY
GLUCOSE-CAPILLARY: 224 mg/dL — AB (ref 65–99)
GLUCOSE-CAPILLARY: 237 mg/dL — AB (ref 65–99)
Glucose-Capillary: 250 mg/dL — ABNORMAL HIGH (ref 65–99)
Glucose-Capillary: 237 mg/dL — ABNORMAL HIGH (ref 65–99)

## 2015-07-19 MED ORDER — LIDOCAINE HCL (PF) 2 % IJ SOLN
100.0000 mg | Freq: Once | INTRAMUSCULAR | Status: DC
Start: 2015-07-19 — End: 2015-07-21
  Filled 2015-07-19: qty 5

## 2015-07-19 MED ORDER — POTASSIUM CHLORIDE CRYS ER 20 MEQ PO TBCR
20.0000 meq | EXTENDED_RELEASE_TABLET | Freq: Two times a day (BID) | ORAL | Status: AC
  Administered 2015-07-19 (×2): 20 meq via ORAL
  Filled 2015-07-19 (×2): qty 1

## 2015-07-19 MED ORDER — LIVING WELL WITH DIABETES BOOK
Freq: Once | Status: AC
  Administered 2015-07-19: 14:00:00
  Filled 2015-07-19: qty 1

## 2015-07-19 MED ORDER — INSULIN GLARGINE 100 UNIT/ML ~~LOC~~ SOLN
30.0000 [IU] | Freq: Every day | SUBCUTANEOUS | Status: DC
  Administered 2015-07-19 – 2015-07-20 (×2): 30 [IU] via SUBCUTANEOUS
  Filled 2015-07-19 (×3): qty 0.3

## 2015-07-19 MED ORDER — LIDOCAINE HCL 2 % IJ SOLN: 5.0000 mL | Freq: Once | INTRAMUSCULAR | Status: DC

## 2015-07-19 MED ORDER — HYDROMORPHONE HCL 1 MG/ML IJ SOLN
1.0000 mg | INTRAMUSCULAR | Status: DC | PRN
  Administered 2015-07-19 – 2015-07-21 (×3): 1 mg via INTRAVENOUS
  Filled 2015-07-19 (×3): qty 1

## 2015-07-19 MED ORDER — NAPROXEN 250 MG PO TABS
500.0000 mg | ORAL_TABLET | Freq: Two times a day (BID) | ORAL | Status: DC
Start: 2015-07-19 — End: 2015-07-21
  Administered 2015-07-19 – 2015-07-21 (×4): 500 mg via ORAL
  Filled 2015-07-19 (×4): qty 2

## 2015-07-19 MED ORDER — LIDOCAINE HCL (CARDIAC) 20 MG/ML IV SOLN
INTRAVENOUS | Status: AC
  Administered 2015-07-19: 20 mg
  Filled 2015-07-19: qty 5

## 2015-07-19 NOTE — Progress Notes (Signed)
Spoke briefly with patient regarding A1C.  Discussed results and explained that goal was 7% or less.  Patient has not had any formal education regarding diabetes but states that his dad has it.  Briefly discussed importance of controlling blood sugars.  He states, I don't know if my weight has caused this but I want to do better. Ordered "Living Well with Diabetes" booklet and dietician consult.  May benefit from further outpatient follow-up with dietician for Medical Nutrition Therapy (MNT) and diabetes education.  Discussed medications for diabetes also.  He used to take Metformin but said that it hurt his stomach.  Patient seems to understand that he needs to improve his health and control of diabetes. Will follow.  Thanks, Beryl MeagerJenny Naiah Donahoe, RN, BC-ADM Inpatient Diabetes Coordinator Pager [phone] ([phone]-5p)

## 2015-07-19 NOTE — Consult Note (Signed)
Urology Consult  Referring physician: Dr. Evette Doffing Reason for referral: scrotal swelling and pain  Chief Complaint: right scrotal pain  History of Present Illness: William Massey is a 28yo with a hx of DMII admitted 3 days ago with scrotal cellulitis. His scrotal swelling start 1 week prior to admission and had been slowly worsening and becoming more painful. He was admitted and placed on broad spectrum antibiotics. His scrotal swelling and pain have worsened since admission.  Ct scan showed no gas in his scrotal wall. He complains of purulent drainage from his right hemiscrotum since yesterday. 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 He denies nay fevers/chill/sweats. He currently has sharp, constant, severe, nonradiating right scrotal pain that is worse with movement.  Past Medical History  Diagnosis Date  . Asthma   . Diabetes mellitus without complication ([place])   . Hypertension    Past Surgical History  Procedure Laterality Date  . Orchiectomy Left 02/19/2015    Procedure: RADICAL ORCHIECTOMY;  Surgeon: Cleon Gustin, MD;  Location: [facility];  Service: Urology;  Laterality: Left;    Medications: I have reviewed the patient's current medications. Allergies: No Known Allergies  No family history on file. Social History:  reports that he has never smoked. He has never used smokeless tobacco. He reports that he uses illicit drugs (Marijuana). He reports that he does not drink alcohol.  Review of Systems  Constitutional: Positive for malaise/fatigue and diaphoresis.  Gastrointestinal: Positive for nausea and abdominal pain.  All other systems reviewed and are negative.   Physical Exam:  Vital signs in last 24 hours: Temp:  [97.8 F (36.6 C)-98 F (36.7 C)] 97.8 F (36.6 C) (11/28 2120) Pulse Rate:  [92-117] 92 (11/28 2120) Resp:  [19-21] 20 (11/28 2120) BP: (146-151)/(79-91) 151/91 mmHg (11/28 2120) SpO2:  [94 %-96 %] 96 %  (11/28 2120) Physical Exam  Constitutional: He is oriented to person, place, and time. He appears well-developed and well-nourished.  HENT:  Head: Normocephalic and atraumatic.  Eyes: EOM are normal. Pupils are equal, round, and reactive to light.  Neck: Normal range of motion. No thyromegaly present.  Cardiovascular: Normal rate and regular rhythm.   Respiratory: Effort normal. No respiratory distress.  GI: Soft. He exhibits no distension. There is no tenderness. There is no rebound.  Genitourinary: Right testis shows swelling and tenderness. Penile erythema and penile tenderness present.  Right hemiscrotum abscess  Musculoskeletal: Normal range of motion.  Neurological: He is alert and oriented to person, place, and time.  Skin: Skin is warm and dry.  Psychiatric: He has a normal mood and affect. His behavior is normal. Judgment and thought content normal.    Laboratory Data:  Results for orders placed or performed during the hospital encounter of 07/16/15 (from the past 72 hour(s))  Urinalysis, Routine w reflex microscopic (not at [facility])     Status: Abnormal   Collection Time: 07/16/15 11:50 PM  Result Value Ref Range   Color, Urine YELLOW YELLOW   APPearance CLEAR CLEAR   Specific Gravity, Urine 1.042 (H) 1.005 - 1.030   pH 5.5 5.0 - 8.0   Glucose, UA >1000 (A) NEGATIVE mg/dL   Hgb urine dipstick NEGATIVE NEGATIVE   Bilirubin Urine NEGATIVE NEGATIVE   Ketones, ur NEGATIVE NEGATIVE mg/dL   Protein, ur NEGATIVE NEGATIVE mg/dL   Nitrite NEGATIVE NEGATIVE   Leukocytes, UA NEGATIVE NEGATIVE  Urine microscopic-add on     Status: Abnormal  Collection Time: 07/16/15 11:50 PM  Result Value Ref Range   Squamous Epithelial / LPF 0-5 (A) NONE SEEN    Comment: Please note change in reference range.   WBC, UA 0-5 0 - 5 WBC/hpf    Comment: Please note change in reference range.   RBC / HPF NONE SEEN 0 - 5 RBC/hpf    Comment: Please note change in reference range.   Bacteria, UA NONE  SEEN NONE SEEN    Comment: Please note change in reference range.  Urine culture     Status: None   Collection Time: 07/16/15 11:50 PM  Result Value Ref Range   Specimen Description URINE, CLEAN CATCH    Special Requests NONE    Culture      MULTIPLE SPECIES PRESENT, SUGGEST RECOLLECTION Performed at [facility]    Report Status 07/19/2015 FINAL   GC/Chlamydia probe amp ([place])not at [facility]     Status: None   Collection Time: 07/17/15 12:00 AM  Result Value Ref Range   Chlamydia Negative     Comment: Normal Reference Range - Negative   Neisseria gonorrhea Negative     Comment: Normal Reference Range - Negative  Culture, blood (routine x 2)     Status: None (Preliminary result)   Collection Time: 07/17/15  1:09 AM  Result Value Ref Range   Specimen Description BLOOD LEFT ARM    Special Requests BOTTLES DRAWN AEROBIC ONLY 7ML    Culture      NO GROWTH 2 DAYS Performed at [facility]    Report Status PENDING   Basic metabolic panel     Status: Abnormal   Collection Time: 07/17/15  1:16 AM  Result Value Ref Range   Sodium 135 135 - 145 mmol/L   Potassium 4.3 3.5 - 5.1 mmol/L   Chloride 97 (L) 101 - 111 mmol/L   CO2 26 22 - 32 mmol/L   Glucose, Bld 480 (H) 65 - 99 mg/dL   BUN 12 6 - 20 mg/dL   Creatinine, Ser 0.84 0.61 - 1.24 mg/dL   Calcium 9.4 8.9 - 10.3 mg/dL   GFR calc non Af Amer >60 >60 mL/min   GFR calc Af Amer >60 >60 mL/min    Comment: (NOTE) The eGFR has been calculated using the CKD EPI equation. This calculation has not been validated in all clinical situations. eGFR's persistently <60 mL/min signify possible Chronic Kidney Disease.    Anion gap 12 5 - 15  Culture, blood (routine x 2)     Status: None (Preliminary result)   Collection Time: 07/17/15  1:16 AM  Result Value Ref Range   Specimen Description BLOOD RIGHT HAND    Special Requests BOTTLES DRAWN AEROBIC AND ANAEROBIC 5ML    Culture      NO GROWTH 2 DAYS Performed at [facility]    Report Status PENDING   I-Stat CG4 Lactic Acid, ED     Status: Abnormal   Collection Time: 07/17/15  1:25 AM  Result Value Ref Range   Lactic Acid, Venous 2.34 (HH) 0.5 - 2.0 mmol/L   Comment NOTIFIED PHYSICIAN   I-stat chem 8, ed     Status: Abnormal   Collection Time: 07/17/15  1:25 AM  Result Value Ref Range   Sodium 135 135 - 145 mmol/L   Potassium 4.2 3.5 - 5.1 mmol/L   Chloride 98 (L) 101 - 111 mmol/L   BUN 13 6 - 20 mg/dL   Creatinine,  Ser 0.80 0.61 - 1.24 mg/dL   Glucose, Bld 493 (H) 65 - 99 mg/dL   Calcium, Ion 1.08 (L) 1.12 - 1.23 mmol/L   TCO2 26 0 - 100 mmol/L   Hemoglobin 17.0 13.0 - 17.0 g/dL   HCT 50.0 39.0 - 52.0 %  CBC with Differential/Platelet     Status: Abnormal   Collection Time: 07/17/15  1:57 AM  Result Value Ref Range   WBC 12.7 (H) 4.0 - 10.5 K/uL   RBC 5.21 4.22 - 5.81 MIL/uL   Hemoglobin 15.0 13.0 - 17.0 g/dL   HCT 43.9 39.0 - 52.0 %   MCV 84.3 78.0 - 100.0 fL   MCH 28.8 26.0 - 34.0 pg   MCHC 34.2 30.0 - 36.0 g/dL   RDW 13.6 11.5 - 15.5 %   Platelets 186 150 - 400 K/uL   Neutrophils Relative % 70 %   Neutro Abs 8.9 (H) 1.7 - 7.7 K/uL   Lymphocytes Relative 18 %   Lymphs Abs 2.3 0.7 - 4.0 K/uL   Monocytes Relative 11 %   Monocytes Absolute 1.4 (H) 0.1 - 1.0 K/uL   Eosinophils Relative 1 %   Eosinophils Absolute 0.1 0.0 - 0.7 K/uL   Basophils Relative 0 %   Basophils Absolute 0.0 0.0 - 0.1 K/uL  CBG monitoring, ED     Status: Abnormal   Collection Time: 07/17/15  5:10 AM  Result Value Ref Range   Glucose-Capillary 334 (H) 65 - 99 mg/dL  I-Stat CG4 Lactic Acid, ED     Status: None   Collection Time: 07/17/15  7:46 AM  Result Value Ref Range   Lactic Acid, Venous 1.11 0.5 - 2.0 mmol/L  Glucose, capillary     Status: Abnormal   Collection Time: 07/17/15  4:41 PM  Result Value Ref Range   Glucose-Capillary 329 (H) 65 - 99 mg/dL  Glucose, capillary     Status: Abnormal   Collection Time: 07/17/15  9:56 PM  Result Value Ref  Range   Glucose-Capillary 250 (H) 65 - 99 mg/dL  GC/Chlamydia probe amp ([place])not at [facility]     Status: None   Collection Time: 07/18/15 12:00 AM  Result Value Ref Range   Chlamydia Negative     Comment: Normal Reference Range - Negative   Neisseria gonorrhea Negative     Comment: Normal Reference Range - Negative  Hemoglobin A1c     Status: Abnormal   Collection Time: 07/18/15  4:40 AM  Result Value Ref Range   Hgb A1c MFr Bld 10.6 (H) 4.8 - 5.6 %    Comment: (NOTE)         Pre-diabetes: 5.7 - 6.4         Diabetes: >6.4         Glycemic control for adults with diabetes: <7.0    Mean Plasma Glucose 258 mg/dL    Comment: (NOTE) Performed At: [facility] [place], [place] [id number] Lindon Romp MD EH:[id number]   CBC     Status: Abnormal   Collection Time: 07/18/15  4:40 AM  Result Value Ref Range   WBC 9.2 4.0 - 10.5 K/uL   RBC 4.54 4.22 - 5.81 MIL/uL   Hemoglobin 12.5 (L) 13.0 - 17.0 g/dL   HCT 39.2 39.0 - 52.0 %   MCV 86.3 78.0 - 100.0 fL   MCH 27.5 26.0 - 34.0 pg   MCHC 31.9 30.0 - 36.0 g/dL   RDW 13.9 11.5 - 15.5 %  Platelets 163 150 - 400 K/uL  Comprehensive metabolic panel     Status: Abnormal   Collection Time: 07/18/15  4:40 AM  Result Value Ref Range   Sodium 136 135 - 145 mmol/L   Potassium 3.6 3.5 - 5.1 mmol/L   Chloride 100 (L) 101 - 111 mmol/L   CO2 25 22 - 32 mmol/L   Glucose, Bld 246 (H) 65 - 99 mg/dL   BUN 7 6 - 20 mg/dL   Creatinine, Ser 0.67 0.61 - 1.24 mg/dL   Calcium 8.3 (L) 8.9 - 10.3 mg/dL   Total Protein 6.2 (L) 6.5 - 8.1 g/dL   Albumin 2.7 (L) 3.5 - 5.0 g/dL   AST 15 15 - 41 U/L   ALT 21 17 - 63 U/L   Alkaline Phosphatase 55 38 - 126 U/L   Total Bilirubin 0.8 0.3 - 1.2 mg/dL   GFR calc non Af Amer >60 >60 mL/min   GFR calc Af Amer >60 >60 mL/min    Comment: (NOTE) The eGFR has been calculated using the CKD EPI equation. This calculation has not been validated in all clinical situations. eGFR's  persistently <60 mL/min signify possible Chronic Kidney Disease.    Anion gap 11 5 - 15  HIV antibody     Status: None   Collection Time: 07/18/15  4:40 AM  Result Value Ref Range   HIV Screen 4th Generation wRfx Non Reactive Non Reactive    Comment: (NOTE) Performed At: [facility] [place], [place] [id number] Lindon Romp MD WU:[id number]   Glucose, capillary     Status: Abnormal   Collection Time: 07/18/15  8:18 AM  Result Value Ref Range   Glucose-Capillary 219 (H) 65 - 99 mg/dL  Glucose, capillary     Status: Abnormal   Collection Time: 07/18/15 12:59 PM  Result Value Ref Range   Glucose-Capillary 249 (H) 65 - 99 mg/dL  Glucose, capillary     Status: Abnormal   Collection Time: 07/18/15  5:47 PM  Result Value Ref Range   Glucose-Capillary 257 (H) 65 - 99 mg/dL  Glucose, capillary     Status: Abnormal   Collection Time: 07/18/15  9:14 PM  Result Value Ref Range   Glucose-Capillary 240 (H) 65 - 99 mg/dL  Basic metabolic panel     Status: Abnormal   Collection Time: 07/19/15  4:25 AM  Result Value Ref Range   Sodium 137 135 - 145 mmol/L   Potassium 3.4 (L) 3.5 - 5.1 mmol/L   Chloride 101 101 - 111 mmol/L   CO2 27 22 - 32 mmol/L   Glucose, Bld 233 (H) 65 - 99 mg/dL   BUN 6 6 - 20 mg/dL   Creatinine, Ser 0.66 0.61 - 1.24 mg/dL   Calcium 8.5 (L) 8.9 - 10.3 mg/dL   GFR calc non Af Amer >60 >60 mL/min   GFR calc Af Amer >60 >60 mL/min    Comment: (NOTE) The eGFR has been calculated using the CKD EPI equation. This calculation has not been validated in all clinical situations. eGFR's persistently <60 mL/min signify possible Chronic Kidney Disease.    Anion gap 9 5 - 15  CBC with Differential/Platelet     Status: Abnormal   Collection Time: 07/19/15  4:25 AM  Result Value Ref Range   WBC 9.1 4.0 - 10.5 K/uL   RBC 4.64 4.22 - 5.81 MIL/uL   Hemoglobin 13.1 13.0 - 17.0 g/dL   HCT 39.5 39.0 - 52.0 %  MCV 85.1 78.0 - 100.0 fL   MCH 28.2 26.0  - 34.0 pg   MCHC 33.2 30.0 - 36.0 g/dL   RDW 13.8 11.5 - 15.5 %   Platelets 184 150 - 400 K/uL   Neutrophils Relative % 65 %   Neutro Abs 5.9 1.7 - 7.7 K/uL   Lymphocytes Relative 20 %   Lymphs Abs 1.8 0.7 - 4.0 K/uL   Monocytes Relative 13 %   Monocytes Absolute 1.2 (H) 0.1 - 1.0 K/uL   Eosinophils Relative 2 %   Eosinophils Absolute 0.2 0.0 - 0.7 K/uL   Basophils Relative 0 %   Basophils Absolute 0.0 0.0 - 0.1 K/uL  Glucose, capillary     Status: Abnormal   Collection Time: 07/19/15  7:54 AM  Result Value Ref Range   Glucose-Capillary 250 (H) 65 - 99 mg/dL  Glucose, capillary     Status: Abnormal   Collection Time: 07/19/15 12:44 PM  Result Value Ref Range   Glucose-Capillary 237 (H) 65 - 99 mg/dL  Glucose, capillary     Status: Abnormal   Collection Time: 07/19/15  5:47 PM  Result Value Ref Range   Glucose-Capillary 224 (H) 65 - 99 mg/dL  Glucose, capillary     Status: Abnormal   Collection Time: 07/19/15  9:25 PM  Result Value Ref Range   Glucose-Capillary 237 (H) 65 - 99 mg/dL   Recent Results (from the past 240 hour(s))  Urine culture     Status: None   Collection Time: 07/16/15 11:50 PM  Result Value Ref Range Status   Specimen Description URINE, CLEAN CATCH  Final   Special Requests NONE  Final   Culture   Final    MULTIPLE SPECIES PRESENT, SUGGEST RECOLLECTION Performed at [facility]    Report Status 07/19/2015 FINAL  Final  Culture, blood (routine x 2)     Status: None (Preliminary result)   Collection Time: 07/17/15  1:09 AM  Result Value Ref Range Status   Specimen Description BLOOD LEFT ARM  Final   Special Requests BOTTLES DRAWN AEROBIC ONLY 7ML  Final   Culture   Final    NO GROWTH 2 DAYS Performed at [facility]    Report Status PENDING  Incomplete  Culture, blood (routine x 2)     Status: None (Preliminary result)   Collection Time: 07/17/15  1:16 AM  Result Value Ref Range Status   Specimen Description BLOOD RIGHT HAND  Final    Special Requests BOTTLES DRAWN AEROBIC AND ANAEROBIC 5ML  Final   Culture   Final    NO GROWTH 2 DAYS Performed at [facility]    Report Status PENDING  Incomplete   Creatinine:  Recent Labs  07/17/15 0116 07/17/15 0125 07/18/15 0440 07/19/15 0425  CREATININE 0.84 0.80 0.67 0.66   Baseline Creatinine: 0.7  Impression/Assessment:  29yo with scrotal cellulitis and scrotal abscess  Plan:  1. I&D or right scrotal abscess performed at the bedside and packed with iodiform gauze. Would culture sent. 2. Please perform daily packing changes 3. Continue broad spectrum antibiotics pending culture 4. Pt will need followup with urology in 2 weeks for a wound check.  Antinio Sanderfer L 07/19/2015, 9:47 PM

## 2015-07-19 NOTE — Progress Notes (Signed)
Subjective: Patient seen and examined this morning.  Pain decently controlled.  Still having swelling in his scrotum that is uncomfortable. States last night, he noted some purulent and bloody drainage and this felt like a relief of some pressure.   Objective: Vital signs in last 24 hours: Filed Vitals:   07/17/15 2049 07/18/15 1431 07/18/15 2136 07/19/15 0607  BP: 158/87 148/86 154/94 146/88  Pulse: 109 114 128 117  Temp: 99.5 F (37.5 C) 98.7 F (37.1 C) 99.6 F (37.6 C) 97.9 F (36.6 C)  TempSrc: Axillary Oral Oral Oral  Resp: 15 16 19 19   Height:      Weight:      SpO2: 94% 93% 95% 95%   Weight change:   Intake/Output Summary (Last 24 hours) at 07/19/15 1127 Last data filed at 07/19/15 0945  Gross per 24 hour  Intake   2296 ml  Output    500 ml  Net   1796 ml   General: resting in bed, no distress HEENT: EOMI, no scleral icterus Cardiac: RRR, heart sounds distant due to habitus Pulm: clear to auscultation bilaterally, moving normal volumes of air Abd: obese, non-tender, large umbilical hernia Ext: warm and well perfused, trace pedal edema Scrotum: testicle is edematous, erythematous but improved slightly from yesterday.  Right side more tender than left.  No obvious opening or area of drainage Neuro: alert and oriented X3, no focal deficits  Lab Results: Basic Metabolic Panel:  Recent Labs Lab 07/18/15 0440 07/19/15 0425  NA 136 137  K 3.6 3.4*  CL 100* 101  CO2 25 27  GLUCOSE 246* 233*  BUN 7 6  CREATININE 0.67 0.66  CALCIUM 8.3* 8.5*   Liver Function Tests:  Recent Labs Lab 07/18/15 0440  AST 15  ALT 21  ALKPHOS 55  BILITOT 0.8  PROT 6.2*  ALBUMIN 2.7*   No results for input(s): LIPASE, AMYLASE in the last 168 hours. No results for input(s): AMMONIA in the last 168 hours. CBC:  Recent Labs Lab 07/17/15 0157 07/18/15 0440 07/19/15 0425  WBC 12.7* 9.2 9.1  NEUTROABS 8.9*  --  5.9  HGB 15.0 12.5* 13.1  HCT 43.9 39.2 39.5  MCV 84.3  86.3 85.1  PLT 186 163 184   Cardiac Enzymes: No results for input(s): CKTOTAL, CKMB, CKMBINDEX, TROPONINI in the last 168 hours. BNP: No results for input(s): PROBNP in the last 168 hours. D-Dimer: No results for input(s): DDIMER in the last 168 hours. CBG:  Recent Labs Lab 07/17/15 2156 07/18/15 0818 07/18/15 1259 07/18/15 1747 07/18/15 2114 07/19/15 0754  GLUCAP 250* 219* 249* 257* 240* 250*   Hemoglobin A1C:  Recent Labs Lab 07/18/15 0440  HGBA1C 10.6*   Fasting Lipid Panel: No results for input(s): CHOL, HDL, LDLCALC, TRIG, CHOLHDL, LDLDIRECT in the last 168 hours. Thyroid Function Tests: No results for input(s): TSH, T4TOTAL, FREET4, T3FREE, THYROIDAB in the last 168 hours. Coagulation: No results for input(s): LABPROT, INR in the last 168 hours. Anemia Panel: No results for input(s): VITAMINB12, FOLATE, FERRITIN, TIBC, IRON, RETICCTPCT in the last 168 hours. Urine Drug Screen: Drugs of Abuse  No results found for: LABOPIA, COCAINSCRNUR, LABBENZ, AMPHETMU, THCU, LABBARB  Alcohol Level: No results for input(s): ETH in the last 168 hours. Urinalysis:  Recent Labs Lab 07/16/15 2350  COLORURINE YELLOW  LABSPEC 1.042*  PHURINE 5.5  GLUCOSEU >1000*  HGBUR NEGATIVE  BILIRUBINUR NEGATIVE  KETONESUR NEGATIVE  PROTEINUR NEGATIVE  NITRITE NEGATIVE  LEUKOCYTESUR NEGATIVE    Micro  Results: Recent Results (from the past 240 hour(s))  Urine culture     Status: None   Collection Time: 07/16/15 11:50 PM  Result Value Ref Range Status   Specimen Description URINE, CLEAN CATCH  Final   Special Requests NONE  Final   Culture   Final    MULTIPLE SPECIES PRESENT, SUGGEST RECOLLECTION Performed at [facility]    Report Status 07/19/2015 FINAL  Final  Culture, blood (routine x 2)     Status: None (Preliminary result)   Collection Time: 07/17/15  1:09 AM  Result Value Ref Range Status   Specimen Description BLOOD LEFT ARM  Final   Special Requests  BOTTLES DRAWN AEROBIC ONLY  Final   Culture   Final    NO GROWTH 1 DAY Performed at [facility]    Report Status PENDING  Incomplete  Culture, blood (routine x 2)     Status: None (Preliminary result)   Collection Time: 07/17/15  1:16 AM  Result Value Ref Range Status   Specimen Description BLOOD RIGHT HAND  Final   Special Requests BOTTLES DRAWN AEROBIC AND ANAEROBIC  Final   Culture   Final    NO GROWTH 1 DAY Performed at [facility]    Report Status PENDING  Incomplete   Studies/Results: No results found. Medications:  Scheduled Meds: . cefTAZidime (FORTAZ)  IV  2 g Intravenous 3 times per day  . enoxaparin (LOVENOX) injection  120 mg Subcutaneous Q24H  . insulin aspart  0-15 Units Subcutaneous TID WC  . insulin aspart  0-5 Units Subcutaneous QHS  . insulin aspart  6 Units Subcutaneous TID WC  . insulin glargine  30 Units Subcutaneous QHS  . potassium chloride  20 mEq Oral BID  . sodium chloride  3 mL Intravenous Q12H  . vancomycin  1,500 mg Intravenous Q8H   Continuous Infusions:  PRN Meds:.acetaminophen **OR** acetaminophen, morphine injection, ondansetron **OR** ondansetron (ZOFRAN) IV, oxyCODONE, senna-docusate Assessment/Plan: Principal Problem:   Sepsis (HCC) Active Problems:   Scrotal edema   Type 2 diabetes mellitus (HCC)   Hypertension associated with diabetes (HCC)   Morbid obesity with BMI of 70 and over, adult (HCC)   Scrotal pain  Scrotal Swelling:  Concerning for cellulitis with risk factors of DM2 and morbid obesity.  - Urology consulted yesterday, recommended continuing current course.  Will contact them again given lack of significant improvement and drainage overnight - Continue Vanc and Ceftaz per pharmacy - WBC 12.7 on admit.  Down to 9.2 >> 9.1 subsequent days - GC/CT pending - Blood cx NGTD - Urine cx with multiple species - Scrotal support, ice - NSAIDs - Tylenol prn - Zofran prn - Oxycodone prn pain - Morphine  prn  DM2: patient states he is on Metformin at home which he is not very compliant with due to GI upset. No A1C on file. Glucose elevated on admission to 493.  CBGs elevated so will increase Lantus to 30 units qhs - A1C 10.6 - Sliding scale insulin - moderate w/ HS coverage plus Novolog 6 units tid w/ meals - Lantus 30 units qhs - At discharge will need close follow up for his uncontrolled DM.  Insulin may not be best option given side effect of weight gain in this patient of 548 pounds.  Metformin non-compliance due to GI upset.  Maybe consider SGLT-2 or GLP-1  HTN: patient states he is on fluid pill but unsure which - Stable. Continue to monitor  Constipation - Senokot-S 1 tablet qhs prn  Morbid Obesity - Carb modified diet  Health Maintenance - HIV negative  FEN Fluids: none Electrolytes: replete prn Nutrition: Carb modified  DVT PPx: Lovenox  Code: FULL  Dispo: Disposition is deferred at this time, awaiting improvement of current medical problems.  Anticipated discharge in approximately 2-3 day(s).   The patient does have a current PCP (No primary care provider on file.) and does not need an [facility] hospital follow-up appointment after discharge.  The patient does not have transportation limitations that hinder transportation to clinic appointments.  .Services Needed at time of discharge: Y = Yes, Blank = No PT:   OT:   RN:   Equipment:   Other:     LOS: 2 days   Gwynn Burly, DO 07/19/2015, 11:27 AM

## 2015-07-19 NOTE — Progress Notes (Signed)
Inpatient Diabetes Program Recommendations  AACE/ADA: New Consensus Statement on Inpatient Glycemic Control (2015)  Target Ranges:  Prepandial:   less than 140 mg/dL      Peak postprandial:   less than 180 mg/dL (1-2 hours)      Critically ill patients:  140 - 180 mg/dL   Review of Glycemic Control:  Results for William Massey, William Massey (MRN [id number]) as of 07/19/2015 10:47  Ref. Range 07/18/2015 08:18 07/18/2015 12:59 07/18/2015 17:47 07/18/2015 21:14 07/19/2015 07:54  Glucose-Capillary Latest Ref Range: 65-99 mg/dL 784219 (H) 696249 (H) 295257 (H) 240 (H) 250 (H)  Results for William Massey, William Massey (MRN [id number]) as of 07/19/2015 10:47  Ref. Range 07/18/2015 04:40  Hemoglobin A1C Latest Ref Range: 4.8-5.6 % 10.6 (H)   Diabetes history: Type 2 Diabetes Outpatient Diabetes medications: Metformin  Current orders for Inpatient glycemic control:  Lantus 15 units daily, Moderate Novolog correction tid with meals and HS, Novolog 6 units tid with meals  Inpatient Diabetes Program Recommendations:   Please consider increasing Lantus to 30 units daily while in the hospital.  Based on A1C, patient may need insulin at discharge, however insulin does often contribute to weight gain.  Consider Endocrinology consult after discharge regarding diabetes? Called and discussed with resident.  Will follow.  Thanks, Beryl MeagerJenny Kynzlee Hucker, RN, BC-ADM Inpatient Diabetes Coordinator Pager [phone] (8a-5p)

## 2015-07-19 NOTE — Care Management Note (Signed)
Case Management Note  Patient Details  Name: William Massey MRN: [id number] Date of Birth: 1986/06/07  Subjective/Objective:                    Action/Plan:  Initial UR completed  Expected Discharge Date:                  Expected Discharge Plan:  Home/Self Care  In-House Referral:     Discharge planning Services     Post Acute Care Choice:    Choice offered to:     DME Arranged:    DME Agency:     HH Arranged:    HH Agency:     Status of Service:  In process, will continue to follow  Medicare Important Message Given:    Date Medicare IM Given:    Medicare IM give by:    Date Additional Medicare IM Given:    Additional Medicare Important Message give by:     If discussed at Long Length of Stay Meetings, dates discussed:    Additional Comments:  William Massey, William Schneiderman Marie, RN 07/19/2015, 11:05 AM

## 2015-07-20 ENCOUNTER — Encounter (HOSPITAL_COMMUNITY): Payer: Self-pay | Admitting: General Practice

## 2015-07-20 LAB — CBC WITH DIFFERENTIAL/PLATELET
Basophils Absolute: 0 10*3/uL (ref 0.0–0.1)
Basophils Relative: 1 %
EOS ABS: 0.2 10*3/uL (ref 0.0–0.7)
EOS PCT: 4 %
HCT: 41.5 % (ref 39.0–52.0)
Hemoglobin: 13.4 g/dL (ref 13.0–17.0)
LYMPHS ABS: 1.4 10*3/uL (ref 0.7–4.0)
Lymphocytes Relative: 26 %
MCH: 28 pg (ref 26.0–34.0)
MCHC: 32.3 g/dL (ref 30.0–36.0)
MCV: 86.8 fL (ref 78.0–100.0)
Monocytes Absolute: 0.4 10*3/uL (ref 0.1–1.0)
Monocytes Relative: 8 %
Neutro Abs: 3.5 10*3/uL (ref 1.7–7.7)
Neutrophils Relative %: 63 %
PLATELETS: 192 10*3/uL (ref 150–400)
RBC: 4.78 MIL/uL (ref 4.22–5.81)
RDW: 13.9 % (ref 11.5–15.5)
WBC: 5.6 10*3/uL (ref 4.0–10.5)

## 2015-07-20 LAB — GLUCOSE, CAPILLARY
GLUCOSE-CAPILLARY: 203 mg/dL — AB (ref 65–99)
GLUCOSE-CAPILLARY: 200 mg/dL — AB (ref 65–99)
GLUCOSE-CAPILLARY: 211 mg/dL — AB (ref 65–99)
Glucose-Capillary: 195 mg/dL — ABNORMAL HIGH (ref 65–99)

## 2015-07-20 LAB — BASIC METABOLIC PANEL
Anion gap: 7 (ref 5–15)
BUN: 7 mg/dL (ref 6–20)
CO2: 28 mmol/L (ref 22–32)
CREATININE: 0.6 mg/dL — AB (ref 0.61–1.24)
Calcium: 8.5 mg/dL — ABNORMAL LOW (ref 8.9–10.3)
Chloride: 104 mmol/L (ref 101–111)
GFR calc Af Amer: >60 mL/min (ref >60)
Glucose, Bld: 245 mg/dL — ABNORMAL HIGH (ref 65–99)
Potassium: 3.9 mmol/L (ref 3.5–5.1)
SODIUM: 139 mmol/L (ref 135–145)

## 2015-07-20 LAB — VANCOMYCIN, TROUGH: VANCOMYCIN TR: 9 ug/mL — AB (ref 10.0–20.0)

## 2015-07-20 MED ORDER — INSULIN ASPART 100 UNIT/ML ~~LOC~~ SOLN
8.0000 [IU] | Freq: Three times a day (TID) | SUBCUTANEOUS | Status: DC
  Administered 2015-07-20 – 2015-07-21 (×4): 8 [IU] via SUBCUTANEOUS

## 2015-07-20 MED ORDER — VANCOMYCIN HCL 10 G IV SOLR
1750.0000 mg | Freq: Three times a day (TID) | INTRAVENOUS | Status: DC
  Administered 2015-07-20 – 2015-07-21 (×2): 1750 mg via INTRAVENOUS
  Filled 2015-07-20 (×5): qty 1750

## 2015-07-20 NOTE — Progress Notes (Signed)
Subjective: Patient seen and examined this morning.  Seen by urology (Dr. Ronne Binning) last night who performed I&D at the bedside and packed with iodiform gauze with wound culture sent.  Today, patient still uncomfortable but does feel some of the pressure has been relieved.  Has noticed some bloody drainage upon standing to use the bathroom.  Denies any purulent drainage.    Objective: Vital signs in last 24 hours: Filed Vitals:   07/19/15 2120 07/20/15 0634 07/20/15 1233 07/20/15 1431  BP: 151/91 142/89 140/88 141/74  Pulse: 92 103 102 97  Temp: 97.8 F (36.6 C) 97.8 F (36.6 C)  98.1 F (36.7 C)  TempSrc: Oral Oral  Oral  Resp: Height:      Weight:      SpO2: 96% 95%  92%   Weight change:   Intake/Output Summary (Last 24 hours) at 07/20/15 1438 Last data filed at 07/20/15 1434  Gross per 24 hour  Intake    940 ml  Output      0 ml  Net    940 ml   General: resting in bed, no distress HEENT: EOMI, no scleral icterus Cardiac: RRR, heart sounds distant due to habitus Pulm: clear to auscultation bilaterally, moving normal volumes of air Abd: obese, non-tender, large umbilical hernia Ext: warm and well perfused, trace pedal edema Scrotum: right testicle is still swollen and tender on the right side of testicle.  Erythema present.  No obvious drainage appreciated. Neuro: alert and oriented X3, no focal deficits  Lab Results: Basic Metabolic Panel:  Recent Labs Lab 07/19/15 0425 07/20/15 0716  NA 137 139  K 3.4* 3.9  CL 101 104  CO2 27 28  GLUCOSE 233* 245*  BUN 6 7  CREATININE 0.66 0.60*  CALCIUM 8.5* 8.5*   Liver Function Tests:  Recent Labs Lab 07/18/15 0440  AST 15  ALT 21  ALKPHOS 55  BILITOT 0.8  PROT 6.2*  ALBUMIN 2.7*   No results for input(s): LIPASE, AMYLASE in the last 168 hours. No results for input(s): AMMONIA in the last 168 hours. CBC:  Recent Labs Lab 07/19/15 0425 07/20/15 0716  WBC 9.1 5.6  NEUTROABS 5.9 3.5  HGB  13.1 13.4  HCT 39.5 41.5  MCV 85.1 86.8  PLT 184 192   Cardiac Enzymes: No results for input(s): CKTOTAL, CKMB, CKMBINDEX, TROPONINI in the last 168 hours. BNP: No results for input(s): PROBNP in the last 168 hours. D-Dimer: No results for input(s): DDIMER in the last 168 hours. CBG:  Recent Labs Lab 07/19/15 0754 07/19/15 1244 07/19/15 1747 07/19/15 2125 07/20/15 0749 07/20/15 1242  GLUCAP 250* 237* 224* 237* 203* 200*   Hemoglobin A1C:  Recent Labs Lab 07/18/15 0440  HGBA1C 10.6*   Fasting Lipid Panel: No results for input(s): CHOL, HDL, LDLCALC, TRIG, CHOLHDL, LDLDIRECT in the last 168 hours. Thyroid Function Tests: No results for input(s): TSH, T4TOTAL, FREET4, T3FREE, THYROIDAB in the last 168 hours. Coagulation: No results for input(s): LABPROT, INR in the last 168 hours. Anemia Panel: No results for input(s): VITAMINB12, FOLATE, FERRITIN, TIBC, IRON, RETICCTPCT in the last 168 hours. Urine Drug Screen: Drugs of Abuse  No results found for: LABOPIA, COCAINSCRNUR, LABBENZ, AMPHETMU, THCU, LABBARB  Alcohol Level: No results for input(s): ETH in the last 168 hours. Urinalysis:  Recent Labs Lab 07/16/15 2350  COLORURINE YELLOW  LABSPEC 1.042*  PHURINE 5.5  GLUCOSEU >1000*  HGBUR NEGATIVE  BILIRUBINUR NEGATIVE  KETONESUR NEGATIVE  PROTEINUR  NEGATIVE  NITRITE NEGATIVE  LEUKOCYTESUR NEGATIVE    Micro Results: Recent Results (from the past 240 hour(s))  Urine culture     Status: None   Collection Time: 07/16/15 11:50 PM  Result Value Ref Range Status   Specimen Description URINE, CLEAN CATCH  Final   Special Requests NONE  Final   Culture   Final    MULTIPLE SPECIES PRESENT, SUGGEST RECOLLECTION Performed at [facility]    Report Status 07/19/2015 FINAL  Final  Culture, blood (routine x 2)     Status: None (Preliminary result)   Collection Time: 07/17/15  1:09 AM  Result Value Ref Range Status   Specimen Description BLOOD LEFT ARM   Final   Special Requests BOTTLES DRAWN AEROBIC ONLY 7ML  Final   Culture   Final    NO GROWTH 3 DAYS Performed at [facility]    Report Status PENDING  Incomplete  Culture, blood (routine x 2)     Status: None (Preliminary result)   Collection Time: 07/17/15  1:16 AM  Result Value Ref Range Status   Specimen Description BLOOD RIGHT HAND  Final   Special Requests BOTTLES DRAWN AEROBIC AND ANAEROBIC 5ML  Final   Culture   Final    NO GROWTH 3 DAYS Performed at [facility]    Report Status PENDING  Incomplete  Wound culture     Status: None (Preliminary result)   Collection Time: 07/19/15  6:28 PM  Result Value Ref Range Status   Specimen Description WOUND  Final   Special Requests SCROTUM  Final   Gram Stain   Final    FEW WBC PRESENT,BOTH PMN AND MONONUCLEAR NO SQUAMOUS EPITHELIAL CELLS SEEN NO ORGANISMS SEEN Performed at [facility]    Culture PENDING  Incomplete   Report Status PENDING  Incomplete   Studies/Results: No results found. Medications:  Scheduled Meds: . cefTAZidime (FORTAZ)  IV  2 g Intravenous 3 times per day  . enoxaparin (LOVENOX) injection  120 mg Subcutaneous Q24H  . insulin aspart  0-15 Units Subcutaneous TID WC  . insulin aspart  0-5 Units Subcutaneous QHS  . insulin aspart  8 Units Subcutaneous TID WC  . insulin glargine  30 Units Subcutaneous QHS  . lidocaine  100 mg Intradermal Once  . naproxen  500 mg Oral BID WC  . sodium chloride  3 mL Intravenous Q12H  . vancomycin  1,500 mg Intravenous Q8H   Continuous Infusions:  PRN Meds:.acetaminophen **OR** acetaminophen, HYDROmorphone (DILAUDID) injection, morphine injection, ondansetron **OR** ondansetron (ZOFRAN) IV, oxyCODONE, senna-docusate Assessment/Plan: Principal Problem:   Sepsis (HCC) Active Problems:   Scrotal edema   Type 2 diabetes mellitus (HCC)   Hypertension associated with diabetes (HCC)   Morbid obesity with BMI of 70 and over, adult (HCC)   Scrotal  pain  Scrotal Cellulitis and Scrotal Abscess:  Risk factors include poorly controlled diabetes and morbid obesity - Seen by urology yesterday who completed I&D at bedside with wound cultures sent.  Appreciate their help - Daily packing changes - Continue Vanc and Ceftaz pending wound cx report - Wound cx with few WBC present, both PMN and mononuclear.  No squamous epithelial cells seen, no organisms seen.  Culture pending - Blood cx NG 3 days - Scrotal support, ice - NSAIDs - Tylenol prn - Zofran prn - Oxycodone prn pain - Dilaudid prn pain  DM2: patient states he is on Metformin at home which he is not very compliant with  due to GI upset. CBGs improved with 30 units Lantus qhs - A1C 10.6 - Sliding scale insulin - moderate w/ HS coverage plus Novolog 6 units tid w/ meals - Lantus 30 units qhs - At discharge will need close follow up for his uncontrolled DM.  Will discuss with patient tomorrow options for insulin vs sulfonylurea.  He will also need to follow with his PCP at discharge to more closely monitor his DM2  HTN: patient states he is on fluid pill but unsure which - Stable. Continue to monitor   Constipation - Senokot-S 1 tablet qhs prn  Morbid Obesity - Carb modified diet - PT eval pending  FEN Fluids: none Electrolytes: replete prn Nutrition: Carb modified  DVT PPx: Lovenox  Code: FULL  Dispo: Disposition is deferred at this time, awaiting improvement of current medical problems.  Anticipated discharge in approximately 2-3 day(s).   The patient does have a current PCP (No primary care provider on file.) and does not need an [facility] hospital follow-up appointment after discharge.  The patient does not have transportation limitations that hinder transportation to clinic appointments.  .Services Needed at time of discharge: Y = Yes, Blank = No PT:   OT:   RN:   Equipment:   Other:     LOS: 3 days   Gwynn Burly, DO 07/20/2015, 2:38 PM

## 2015-07-20 NOTE — Plan of Care (Signed)
Problem: Food- and Nutrition-Related Knowledge Deficit (NB-1.1) Goal: Nutrition education Formal process to instruct or train a patient/client in a skill or to impart knowledge to help patients/clients voluntarily manage or modify food choices and eating behavior to maintain or improve health. Outcome: Adequate for Discharge  RD consulted for nutrition education regarding diabetes.     Lab Results  Component Value Date    HGBA1C 10.6* 07/18/2015   Pt reports that he has had diabetes for several years and goes through periods of good and poor control. He shares that he was able to decrease dosages of his prescription medications last year by working out at the gym and planning his meals ahead of time. He shares that he has success when he does this for a while, however, is often sidetracked by "excuses" (ex. His friend whom he worked out with moved out of town). He reports his largest barriers to optimal glycemic control is portion control.   Spent most of the visit discussing ways that pt can make lifestyle changes to assist with weight management and diabetes self-management.   RD provided "Carbohydrate Counting for People with Diabetes" handout from the Academy of Nutrition and Dietetics. Discussed different food groups and their effects on blood sugar, emphasizing carbohydrate-containing foods. Provided list of carbohydrates and recommended serving sizes of common foods.  Discussed importance of controlled and consistent carbohydrate intake throughout the day. Provided examples of ways to balance meals/snacks and encouraged intake of high-fiber, whole grain complex carbohydrates. Teach back method used.  Expect fair to good compliance.  Body mass index is 72.35 kg/(m^2). Pt meets criteria for extreme obesity, class III based on current BMI.  Current diet order is Carb Modified, patient is consuming approximately 100% of meals at this time. Labs and medications reviewed. No further nutrition  interventions warranted at this time. RD contact information provided. If additional nutrition issues arise, please re-consult RD.  Amine Adelson A. Mayford KnifeWilliams, RD, LDN, CDE Pager: [phone] After hours Pager: [phone]

## 2015-07-20 NOTE — Progress Notes (Signed)
ANTIBIOTIC CONSULT NOTE  Pharmacy Consult for Vancomycin, ceftazidime Indication: possible necrotizing fasciitis  No Known Allergies  Patient Measurements: Height: 6\' 1"  (185.4 cm) Weight: (!) 548 lb 4 oz (248.685 kg) IBW/kg (Calculated) : 79.9 Adjusted Body Weight:   Vital Signs: Temp: 98.1 F (36.7 C) (11/29 1431) Temp Source: Oral (11/29 1431) BP: 141/74 mmHg (11/29 1431) Pulse Rate: 97 (11/29 1431) Intake/Output from previous day: 11/28 0701 - 11/29 0700 In: 4790 [P.O.:940; IV Piggyback:3850] Out: -  Intake/Output from this shift: Total I/O In: 240 [P.O.:240] Out: -   Labs:  Recent Labs  07/18/15 0440 07/19/15 0425 07/20/15 0716  WBC 9.2 9.1 5.6  HGB 12.5* 13.1 13.4  PLT 163 184 192  CREATININE 0.67 0.66 0.60*   Estimated Creatinine Clearance: 278.9 mL/min (by C-G formula based on Cr of 0.6). No results for input(s): VANCOTROUGH, VANCOPEAK, VANCORANDOM, GENTTROUGH, GENTPEAK, GENTRANDOM, TOBRATROUGH, TOBRAPEAK, TOBRARND, AMIKACINPEAK, AMIKACINTROU, AMIKACIN in the last 72 hours.   Medical History: Past Medical History  Diagnosis Date  . Asthma   . Diabetes mellitus without complication (HCC)   . Hypertension     Medications:  Anti-infectives    Start     Dose/Rate Route Frequency Ordered Stop   07/17/15 1400  vancomycin (VANCOCIN) 1,500 mg in sodium chloride 0.9 % 500 mL IVPB     1,500 mg 250 mL/hr over 120 Minutes Intravenous Every 8 hours 07/17/15 0507     07/17/15 1400  cefTAZidime (FORTAZ) 2 g in dextrose 5 % 50 mL IVPB     2 g 100 mL/hr over 30 Minutes Intravenous 3 times per day 07/17/15 0507     07/17/15 0130  clindamycin (CLEOCIN) IVPB 900 mg     900 mg 100 mL/hr over 30 Minutes Intravenous  Once 07/17/15 0122 07/17/15 0355   07/17/15 0130  cefTAZidime (FORTAZ) 2 g in dextrose 5 % 50 mL IVPB     2 g 100 mL/hr over 30 Minutes Intravenous  Once 07/17/15 0125 07/17/15 0354   07/17/15 0130  vancomycin (VANCOCIN) 2,000 mg in sodium chloride 0.9  % 500 mL IVPB     2,000 mg 250 mL/hr over 120 Minutes Intravenous  Once 07/17/15 0125 07/17/15 0617     Assessment: Patient with possible necrotizing fasciitis.  Admitted with testicular pain and swelling. Patient with weight > 200kg. On day #4 vanc/ceftaz. Had similar presentation in July 2016 - had a radical orchiectomy at that time. Renal fx is stable.  Goal of Therapy:  Ceftazidime dosed based on patient weight and renal function  Vancomycin trough level 15-20 mcg/ml Appropriate antibiotic dosing for renal function; eradication of infection  Plan:  Vancomycin 1500 mg IV q8h Ceftazdime 2 g iv q8hr VT this afternoon Wach renal fx F/u transition to PO abx  Baldemar FridayMasters, Oris Calmes M 07/20/2015,2:41 PM

## 2015-07-20 NOTE — Evaluation (Signed)
Physical Therapy Evaluation Patient Details Name: William Massey MRN: [id number] DOB: 14-Sep-1985 Today's Date: 07/20/2015   History of Present Illness  Mr. William Massey is a 29 y.o. 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 Ronne Binning (Urology). Admitted with fever, leukocytosis, concern for sepsis; scrotal edema; Urology performed bedside I&D fo R scrotum on11/27   Clinical Impression   Patient evaluated by Physical Therapy with no further acute PT needs identified. All education has been completed and the patient has no further questions.  See below for any follow-up Physical Therapy or equipment needs. PT is signing off. Thank you for this referral.     Follow Up Recommendations No PT follow up    Equipment Recommendations  None recommended by PT    Recommendations for Other Services       Precautions / Restrictions Precautions Precautions: None      Mobility  Bed Mobility Overal bed mobility: Modified Independent             General bed mobility comments: HOB elevated  Transfers Overall transfer level: Independent                  Ambulation/Gait Ambulation/Gait assistance: Independent Ambulation Distance (Feet): 30 Feet Assistive device: None Gait Pattern/deviations: Wide base of support     General Gait Details: Overall managing well; amb distance limited by his dressing falling out; ended session back in bed for dressing change  Stairs            Wheelchair Mobility    Modified Rankin (Stroke Patients Only)       Balance                                             Pertinent Vitals/Pain Pain Assessment: Faces Faces Pain Scale: Hurts whole lot Pain Location: scrotum with movement; little to no pain at rest Pain Descriptors / Indicators: Pressure;Sharp Pain Intervention(s): Monitored during session    Home Living Family/patient  expects to be discharged to:: Private residence Living Arrangements: Other relatives;Children Available Help at Discharge: Family;Available PRN/intermittently Type of Home: House Home Access: Stairs to enter   [employer] of Steps: 6 Home Layout: One level        Prior Function Level of Independence: Independent         Comments: works at the [employer]        Extremity/Trunk Assessment   Upper Extremity Assessment: Overall WFL for tasks assessed           Lower Extremity Assessment: Overall WFL for tasks assessed (though hip ROM grossly limited by body habitus)         Communication   Communication: No difficulties  Cognition Arousal/Alertness: Awake/alert Behavior During Therapy: WFL for tasks assessed/performed Overall Cognitive Status: Within Functional Limits for tasks assessed                      General Comments      Exercises        Assessment/Plan    PT Assessment Patent does not need any further PT services  PT Diagnosis Difficulty walking   PT Problem List    PT Treatment Interventions     PT Goals (Current goals can be found in the Care Plan section) Acute  Rehab PT Goals Patient Stated Goal: wants the infection to heal PT Goal Formulation: All assessment and education complete, DC therapy    Frequency     Barriers to discharge        Co-evaluation               End of Session   Activity Tolerance: Patient tolerated treatment well Patient left: in bed;with call bell/phone within reach (RN preparing fo rdressing change) Nurse Communication: Mobility status         Time: [id number] PT Time Calculation (min) (ACUTE ONLY): 34 min   Charges:   PT Evaluation $Initial PT Evaluation Tier I: 1 Procedure PT Treatments $Gait Training: 8-22 mins   PT G CodesOlen Pel:        Kimiya Brunelle Hamff 07/20/2015, 2:57 PM  Van ClinesHolly Kevyn Boquet, [place]  Acute Rehabilitation Services Pager [phone] Office  [phone]

## 2015-07-20 NOTE — Progress Notes (Signed)
ANTIBIOTIC CONSULT NOTE  Pharmacy Consult for Vancomycin, ceftazidime Indication: possible necrotizing fasciitis  No Known Allergies  Patient Measurements: Height: 6\' 1"  (185.4 cm) Weight: (!) 548 lb 4 oz (248.685 kg) IBW/kg (Calculated) : 79.9  Vital Signs: Temp: 98.1 F (36.7 C) (11/29 1431) Temp Source: Oral (11/29 1431) BP: 141/74 mmHg (11/29 1431) Pulse Rate: 97 (11/29 1431) I Labs:  Recent Labs  07/18/15 0440 07/19/15 0425 07/20/15 0716  WBC 9.2 9.1 5.6  HGB 12.5* 13.1 13.4  PLT 163 184 192  CREATININE 0.67 0.66 0.60*     Microbiology: Cx data:  11/26 BCx: ngtd  11/26 urine: sent 11/28 scrotal wound: px   Anti-infective's  Vanc 11/26 >>  Ceftaz 11/26>>  Assessment: Admitted with testicular pain, swelling and scrotal cellulitis/abscess. S/p bedside I&D by urology on 11/28. Patient with weight > 200kg. On day #4 vanc/ceftaz. Had similar presentation in July 2016 - had a radical orchiectomy at that time. Cellulitis slow to respond to IV abx this admission.  11//28 VT- 9 on 1500 mg Q8H (drawn correctly) and SCr 0.60  Goal of Therapy:  Appropriate antibiotic dosing for renal function; eradication of infection   Plan:  1. VT is below desired range of 10-20 based on indication; will increase dose to 1750 mg Q8H starting tonight as afternoon dose has already been given 2. Ceftazdime 2 g iv q8hr 3. Wach renal fx 4. F/u transition to PO abx  Pollyann SamplesAndy Jaspreet Hollings, PharmD, BCPS 07/20/2015, 3:48 PM Pager: [phone]

## 2015-07-21 DIAGNOSIS — Z9889 Other specified postprocedural states: Secondary | ICD-10-CM

## 2015-07-21 DIAGNOSIS — L0291 Cutaneous abscess, unspecified: Secondary | ICD-10-CM | POA: Insufficient documentation

## 2015-07-21 DIAGNOSIS — L0390 Cellulitis, unspecified: Secondary | ICD-10-CM

## 2015-07-21 DIAGNOSIS — B951 Streptococcus, group B, as the cause of diseases classified elsewhere: Secondary | ICD-10-CM

## 2015-07-21 DIAGNOSIS — E1165 Type 2 diabetes mellitus with hyperglycemia: Secondary | ICD-10-CM

## 2015-07-21 DIAGNOSIS — Z6841 Body Mass Index (BMI) 40.0 and over, adult: Secondary | ICD-10-CM

## 2015-07-21 DIAGNOSIS — N492 Inflammatory disorders of scrotum: Secondary | ICD-10-CM

## 2015-07-21 LAB — CBC WITH DIFFERENTIAL/PLATELET
BASOS ABS: 0 10*3/uL (ref 0.0–0.1)
BASOS PCT: 0 %
EOS PCT: 5 %
Eosinophils Absolute: 0.3 10*3/uL (ref 0.0–0.7)
HEMATOCRIT: 39.6 % (ref 39.0–52.0)
Hemoglobin: 12.7 g/dL — ABNORMAL LOW (ref 13.0–17.0)
Lymphocytes Relative: 37 %
Lymphs Abs: 1.9 10*3/uL (ref 0.7–4.0)
MCH: 27.8 pg (ref 26.0–34.0)
MCHC: 32.1 g/dL (ref 30.0–36.0)
MCV: 86.7 fL (ref 78.0–100.0)
MONO ABS: 0.4 10*3/uL (ref 0.1–1.0)
MONOS PCT: 8 %
NEUTROS ABS: 2.6 10*3/uL (ref 1.7–7.7)
Neutrophils Relative %: 50 %
PLATELETS: 195 10*3/uL (ref 150–400)
RBC: 4.57 MIL/uL (ref 4.22–5.81)
RDW: 13.8 % (ref 11.5–15.5)
WBC: 5.2 10*3/uL (ref 4.0–10.5)

## 2015-07-21 LAB — GLUCOSE, CAPILLARY
Glucose-Capillary: 198 mg/dL — ABNORMAL HIGH (ref 65–99)
Glucose-Capillary: 185 mg/dL — ABNORMAL HIGH (ref 65–99)

## 2015-07-21 MED ORDER — SENNOSIDES-DOCUSATE SODIUM 8.6-50 MG PO TABS: 1.0000 | ORAL_TABLET | Freq: Every evening | ORAL | Status: DC | PRN

## 2015-07-21 MED ORDER — GLIPIZIDE 5 MG PO TABS: 5.0000 mg | ORAL_TABLET | Freq: Every day | ORAL | Status: DC

## 2015-07-21 MED ORDER — OXYCODONE HCL 5 MG PO TABS: 5.0000 mg | ORAL_TABLET | ORAL | Status: DC | PRN

## 2015-07-21 MED ORDER — HYDROCHLOROTHIAZIDE 12.5 MG PO CAPS
12.5000 mg | ORAL_CAPSULE | Freq: Every day | ORAL | Status: DC
  Administered 2015-07-21: 12.5 mg via ORAL
  Filled 2015-07-21: qty 1

## 2015-07-21 MED ORDER — SULFAMETHOXAZOLE-TRIMETHOPRIM 800-160 MG PO TABS: 1.0000 | ORAL_TABLET | Freq: Two times a day (BID) | ORAL | Status: DC

## 2015-07-21 MED ORDER — LISINOPRIL 10 MG PO TABS: 10.0000 mg | ORAL_TABLET | Freq: Every day | ORAL | Status: DC

## 2015-07-21 NOTE — Progress Notes (Signed)
Pt's Bp 181/107 PR 98, RR 20, called oncall MD to inform, gave oxycodone IR 5 mg, rechecked  BP 179/98

## 2015-07-21 NOTE — Care Management Note (Signed)
Case Management Note  Patient Details  Name: William Massey MRN: [id number] Date of Birth: 1986/04/23  Subjective/Objective:                    Action/Plan:  Confirmed with patient face sheet information. [facility]ained [facility] teach him / someone else how to do dressing change , patient states he has someone at home willing to be taught. Patient wanted [facility] , spoke with Delorise ShinerFarrah Harrison at [facility] they are not in network  With [employer] . Patient aware and would like [facility] to Darl PikesSusan at [facility]. Expected Discharge Date:                  Expected Discharge Plan:  Home w Home Health Services  In-House Referral:     Discharge planning Services  CM Consult  Post Acute Care Choice:  Home Health Choice offered to:  Patient  DME Arranged:    DME Agency:     HH Arranged:  RN HH Agency:  [facility]  Status of Service:  Completed, signed off  Medicare Important Message Given:    Date Medicare IM Given:    Medicare IM give by:    Date Additional Medicare IM Given:    Additional Medicare Important Message give by:     If discussed at Long Length of Stay Meetings, dates discussed:    Additional Comments:  Kingsley PlanWile, Laurell Coalson Marie, RN 07/21/2015, 11:17 AM

## 2015-07-21 NOTE — Discharge Instructions (Signed)
We have prescribed you with the following medications at discharge for your abscess and cellulitis: - Bactrim DS.  Take this medicine as prescribed until December 12 - Please follow up with Dr. Ronne BinningMcKenzie (urology) in about 10 days for a wound recheck - We have also put an order in for you to receive home health nursing services to provide packing changes for your incision site.  For your high blood pressure, we have prescribed you Lisinopril 10mg  daily.  Please stop taking your HCTZ with this medication.  Please follow up with your Primary Care Doctor next week check your blood work on this new medication.  They may need to make adjustments to your dosing of the medication as well.  It is very important you follow up with your Primary Doctor.  For your diabetes, we have prescribed you Glipizide 5mg  daily.  Hopefully you will be able to tolerate this medication better than Metformin.

## 2015-07-21 NOTE — Progress Notes (Signed)
Note that patient being discharged home today.  Spoke with patient again regarding A1C and the importance of glycemic control after discharge.  Note that patient is to f/u with PCP regarding diabetes plan.  Discussed diabetes management with patient including the benefits of weight loss and exercise.  Patient states he was intolerant to Metformin.  Discussed other medication options with patient such as GLP-1 inhibitors and suggested that he discuss with his PCP.  He will need follow-up with PCP ASAP regarding diabetes management.  He would also benefit from continued education regarding diabetes management.  Will place order for Outpatient diabetes education per protocol/co-sign required.   He seems to grasp the importance of diabetes management at this point however needs continued support and coaching for adequate control of blood sugars.  Thanks, Beryl MeagerJenny Ivaan Liddy, RN, BC-ADM Inpatient Diabetes Coordinator Pager [phone] (8a-5p)

## 2015-07-21 NOTE — Progress Notes (Signed)
Subjective: Patient seen and examined this morning.  No acute events overnight.  Status post I&D x 2 days and doing well.  Patient doing better today.  Reports some mildly bloody drainage when getting up to go to the bathroom.  Has been up and out of bed.  No other complaints.  Objective: Vital signs in last 24 hours: Filed Vitals:   07/20/15 1431 07/20/15 2057 07/21/15 0610 07/21/15 0644  BP: 141/74 139/83 181/107 179/98  Pulse: 97 88 98 102  Temp: 98.1 F (36.7 C) 98.1 F (36.7 C) 97.5 F (36.4 C)   TempSrc: Oral Oral Oral   Resp: 20 20 20    Height:      Weight:      SpO2: 92% 92% 89%    Weight change:   Intake/Output Summary (Last 24 hours) at 07/21/15 1041 Last data filed at 07/21/15 0943  Gross per 24 hour  Intake   1650 ml  Output      0 ml  Net   1650 ml   General: resting in bed, no distress HEENT: EOMI, no scleral icterus Cardiac: RRR, heart sounds distant due to habitus Pulm: clear to auscultation bilaterally, moving normal volumes of air Abd: obese, non-tender, large umbilical hernia Ext: warm and well perfused Scrotum: right testicle has swelling that is improved.  Tenderness localized to area of I&D.  Erythema has improved.  No obvious drainage appreciated Neuro: alert and oriented X3, no focal deficits  Lab Results: Basic Metabolic Panel:  Recent Labs Lab 07/19/15 0425 07/20/15 0716  NA 137 139  K 3.4* 3.9  CL 101 104  CO2 27 28  GLUCOSE 233* 245*  BUN 6 7  CREATININE 0.66 0.60*  CALCIUM 8.5* 8.5*   Liver Function Tests:  Recent Labs Lab 07/18/15 0440  AST 15  ALT 21  ALKPHOS 55  BILITOT 0.8  PROT 6.2*  ALBUMIN 2.7*   No results for input(s): LIPASE, AMYLASE in the last 168 hours. No results for input(s): AMMONIA in the last 168 hours. CBC:  Recent Labs Lab 07/20/15 0716 07/21/15 0342  WBC 5.6 5.2  NEUTROABS 3.5 2.6  HGB 13.4 12.7*  HCT 41.5 39.6  MCV 86.8 86.7  PLT 192 195   Cardiac Enzymes: No results for input(s):  CKTOTAL, CKMB, CKMBINDEX, TROPONINI in the last 168 hours. BNP: No results for input(s): PROBNP in the last 168 hours. D-Dimer: No results for input(s): DDIMER in the last 168 hours. CBG:  Recent Labs Lab 07/19/15 2125 07/20/15 0749 07/20/15 1242 07/20/15 1703 07/20/15 2054 07/21/15 0720  GLUCAP 237* 203* 200* 211* 195* 198*   Hemoglobin A1C:  Recent Labs Lab 07/18/15 0440  HGBA1C 10.6*   Fasting Lipid Panel: No results for input(s): CHOL, HDL, LDLCALC, TRIG, CHOLHDL, LDLDIRECT in the last 168 hours. Thyroid Function Tests: No results for input(s): TSH, T4TOTAL, FREET4, T3FREE, THYROIDAB in the last 168 hours. Coagulation: No results for input(s): LABPROT, INR in the last 168 hours. Anemia Panel: No results for input(s): VITAMINB12, FOLATE, FERRITIN, TIBC, IRON, RETICCTPCT in the last 168 hours. Urine Drug Screen: Drugs of Abuse  No results found for: LABOPIA, COCAINSCRNUR, LABBENZ, AMPHETMU, THCU, LABBARB  Alcohol Level: No results for input(s): ETH in the last 168 hours. Urinalysis:  Recent Labs Lab 07/16/15 2350  COLORURINE YELLOW  LABSPEC 1.042*  PHURINE 5.5  GLUCOSEU >1000*  HGBUR NEGATIVE  BILIRUBINUR NEGATIVE  KETONESUR NEGATIVE  PROTEINUR NEGATIVE  NITRITE NEGATIVE  LEUKOCYTESUR NEGATIVE    Micro Results: Recent Results (  from the past 240 hour(s))  Urine culture     Status: None   Collection Time: 07/16/15 11:50 PM  Result Value Ref Range Status   Specimen Description URINE, CLEAN CATCH  Final   Special Requests NONE  Final   Culture   Final    MULTIPLE SPECIES PRESENT, SUGGEST RECOLLECTION Performed at [facility]    Report Status 07/19/2015 FINAL  Final  Culture, blood (routine x 2)     Status: None (Preliminary result)   Collection Time: 07/17/15  1:09 AM  Result Value Ref Range Status   Specimen Description BLOOD LEFT ARM  Final   Special Requests BOTTLES DRAWN AEROBIC ONLY  Final   Culture   Final    NO GROWTH 3  DAYS Performed at [facility]    Report Status PENDING  Incomplete  Culture, blood (routine x 2)     Status: None (Preliminary result)   Collection Time: 07/17/15  1:16 AM  Result Value Ref Range Status   Specimen Description BLOOD RIGHT HAND  Final   Special Requests BOTTLES DRAWN AEROBIC AND ANAEROBIC  Final   Culture   Final    NO GROWTH 3 DAYS Performed at [facility]    Report Status PENDING  Incomplete  Wound culture     Status: None (Preliminary result)   Collection Time: 07/19/15  6:28 PM  Result Value Ref Range Status   Specimen Description WOUND  Final   Special Requests SCROTUM  Final   Gram Stain   Final    FEW WBC PRESENT,BOTH PMN AND MONONUCLEAR NO SQUAMOUS EPITHELIAL CELLS SEEN NO ORGANISMS SEEN Performed at [facility]    Culture PENDING  Incomplete   Report Status PENDING  Incomplete   Studies/Results: No results found. Medications:  Scheduled Meds: . cefTAZidime (FORTAZ)  IV  2 g Intravenous 3 times per day  . enoxaparin (LOVENOX) injection  120 mg Subcutaneous Q24H  . hydrochlorothiazide  12.5 mg Oral Daily  . insulin aspart  0-15 Units Subcutaneous TID WC  . insulin aspart  0-5 Units Subcutaneous QHS  . insulin aspart  8 Units Subcutaneous TID WC  . insulin glargine  30 Units Subcutaneous QHS  . lidocaine  100 mg Intradermal Once  . naproxen  500 mg Oral BID WC  . sodium chloride  3 mL Intravenous Q12H  . vancomycin  1,750 mg Intravenous Q8H   Continuous Infusions:  PRN Meds:.acetaminophen **OR** acetaminophen, HYDROmorphone (DILAUDID) injection, ondansetron **OR** ondansetron (ZOFRAN) IV, oxyCODONE, senna-docusate Assessment/Plan: Principal Problem:   Sepsis (HCC) Active Problems:   Scrotal edema   Type 2 diabetes mellitus (HCC)   Hypertension associated with diabetes (HCC)   Morbid obesity with BMI of 70 and over, adult [facility])   Scrotal pain  Scrotal Cellulitis and Scrotal Abscess:  Risk factors include poorly  controlled diabetes and morbid obesity - Seen by urology 11/28 who completed I&D at bedside with wound cultures sent.  Appreciate their help - Daily packing changes continued.  Will try to arrange home health RN to help with this at discharge - Vanc and Ceftaz since 11/26.  Will discharge on TMP-SMX x 14 from source control.  Last day of treatment 12/12.  This will provide GNR and staph coverage given purulence of abscess - Wound cx Gram stain with few WBC present, both PMN and mononuclear.  No squamous epithelial cells seen, no organisms seen.  Will f/u final cx if adjustment to therapy needed - Blood  cx NGTD - Scrotal support, ice - NSAIDs - Tylenol prn - Zofran prn - Oxycodone prn pain - Dilaudid prn pain  DM2: patient states he is on Metformin at home which he is not very compliant with due to GI upset. CBGs improved with 30 units Lantus qhs - A1C 10.6 - Sliding scale insulin - moderate w/ HS coverage plus Novolog 6 units tid w/ meals - Lantus 30 units qhs - At discharge will need close follow up for his uncontrolled DM.  Will discharge home on Glipizide with close PCP follow up.  HTN: patient states he is on fluid pill but unsure which but thinks it is HCTZ - BP elevated last night and this morning - Will dishcarge home on lisinopril with close follow up with his PCP  Constipation - Senokot-S 1 tablet qhs prn  Morbid Obesity - Carb modified diet - PT eval with no further recs - Needs close outpatient follow up for his weight.  FEN Fluids: none Electrolytes: replete prn Nutrition: Carb modified  DVT PPx: Lovenox  Code: FULL  Dispo: Discharge home today.  The patient does have a current PCP Heywood Bene, MD) and does not need an [facility] hospital follow-up appointment after discharge.  The patient does not have transportation limitations that hinder transportation to clinic appointments.  .Services Needed at time of discharge: Y = Yes, Blank = No PT:   OT:   RN:     Equipment:   Other:     LOS: 4 days   Gwynn Burly, DO 07/21/2015, 10:41 AM

## 2015-07-21 NOTE — Progress Notes (Signed)
Pt discharged home with his mother in stable condition. Discharge instructions provided to pt and his mom with no concerns voiced

## 2015-07-21 NOTE — Progress Notes (Signed)
 1mg  iv dilaudid administered pre dressing change

## 2015-07-21 NOTE — Discharge Summary (Signed)
Name: William Massey MRN: [id number] DOB: 1986/08/08 29 y.o. PCP: Heywood Bene, MD  Date of Admission: 07/16/2015 11:07 PM Date of Discharge: 07/21/2015 Attending Physician: Tyson Alias, MD  Discharge Diagnosis: 1. Scrotal Cellulitis Principal Problem:   Sepsis (HCC) Active Problems:   Scrotal edema   Type 2 diabetes mellitus (HCC)   Hypertension associated with diabetes (HCC)   Morbid obesity with BMI of 70 and over, adult (HCC)   Scrotal pain   Cellulitis and abscess  Discharge Medications:   Medication List    STOP taking these medications        oxyCODONE-acetaminophen 5-325 MG tablet  Commonly known as:  ROXICET      TAKE these medications        glipiZIDE 5 MG tablet  Commonly known as:  GLUCOTROL  Take 1 tablet (5 mg total) by mouth daily before breakfast.     ibuprofen 200 MG tablet  Commonly known as:  ADVIL,MOTRIN  Take 400 mg by mouth every 6 (six) hours as needed (for pain.).     lisinopril 10 MG tablet  Commonly known as:  PRINIVIL  Take 1 tablet (10 mg total) by mouth daily.     oxyCODONE 5 MG immediate release tablet  Commonly known as:  Oxy IR/ROXICODONE  Take 1 tablet (5 mg total) by mouth every 4 (four) hours as needed for moderate pain or severe pain.     senna-docusate 8.6-50 MG tablet  Commonly known as:  Senokot-S  Take 1 tablet by mouth at bedtime as needed for mild constipation.     sulfamethoxazole-trimethoprim 800-160 MG tablet  Commonly known as:  BACTRIM DS,SEPTRA DS  Take 1 tablet by mouth 2 (two) times daily.        Disposition and follow-up:   William Massey was discharged from [facility] in Stable condition.  At the hospital follow up visit please address:  1.  Patients completion of antibiotics.  He was discharged to home with course of TMP-SMX (last day of tx 08/02/2015.  Please make sure patient has scheduled follow up with Urology within 2 weeks from discharge.  Please make sure  patient has been receiving home health nursing to help with packing changes.  Please address patients diabetes management.  He previously was not compliant with Metformin due to GI side effects so we discharged him on Glipizide 5mg  daily.  Please address his HTN management.  Due to his DM, an ACE inhibitor is warranted and so this was also started at discharge.  2.  Labs / imaging needed at time of follow-up: BMP with addition of ACE inhibitor to BP management  3.  Pending labs/ test needing follow-up: none  Follow-up Appointments:     Follow-up Information    Follow up with Lucila Maine, MD. Schedule an appointment as soon as possible for a visit in 1 week.   Specialty:  Family Medicine   Contact information:   [address] [place] [address] [phone]       Follow up with MCKENZIE, PATRICK L, MD. Schedule an appointment as soon as possible for a visit in 1 week.   Specialty:  Urology   Why:  they will call you to schedule an appointment.  if you do not here from them by the end of the week, call their office   Contact information:   [address] [place] [address] [phone]       Discharge Instructions: Discharge Instructions  Ambulatory referral to Nutrition and Diabetic Education    Complete by:  As directed   A1C=10.1%.  Needs 1:1 with CDE regarding diabetes management     Diet - low sodium heart healthy    Complete by:  As directed      Increase activity slowly    Complete by:  As directed            Consultations: Treatment Team:  Malen GauzePatrick L McKenzie, MD  Procedures Performed:  Dg Pelvis 1-2 Views  07/17/2015  CLINICAL DATA:  Acute onset of scrotal swelling and groin pain. Initial encounter. EXAM: PELVIS - 1-2 VIEW COMPARISON:  None. FINDINGS: There is no evidence of fracture or dislocation. Both femoral heads are seated normally within their respective acetabula. No significant degenerative change is appreciated. The sacroiliac joints are  unremarkable in appearance. Slight widening of the pubic symphysis is nonspecific. The visualized bowel gas pattern is grossly unremarkable in appearance. IMPRESSION: No evidence of fracture or dislocation. Electronically Signed   By: Roanna RaiderJeffery  Chang M.D.   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 Continued pain/swelling to right testicle. Four rounds of antibiotics without improvement. Evaluate for necrotizing fasciitis. EXAM: CT PELVIS WITH CONTRAST TECHNIQUE: Multidetector CT imaging of the pelvis was performed using the standard protocol following the bolus administration of intravenous contrast. CONTRAST:  100mL OMNIPAQUE IOHEXOL 300 MG/ML  SOLN COMPARISON:  None. FINDINGS: Appendix is normal. The bladder, prostate and rectum are within normal. No evidence of free pelvic fluid. A few small bilateral symmetrical superficial inguinal lymph nodes. There is moderate scrotal wall edema/skin thickening with subcutaneous edema within the right peroneal region. There is also mild subcutaneous edema anterior lateral and adjacent to the left spermatic cord above the scrotum. No air within the soft tissues. No focal abscess visualized. Remaining bones soft tissues are unremarkable. IMPRESSION: Subcutaneous edema over the right perineal region extending inferiorly to the scrotum with diffuse scrotal wall edema/skin thickening. Mild subcutaneous edema adjacent to and anterior lateral to the left spermatic cord above the scrotum. Findings may be due to soft tissue infection. No evidence of air within the soft tissues to suggest gas-forming infection and no focal abscess. Electronically Signed   By: Elberta Fortisaniel  Boyle M.D.   On: 07/17/2015 07:58   [place] Scrotum  07/17/2015  CLINICAL DATA:  Acute onset of right-sided testicular pain for 3 days. Initial encounter. EXAM: SCROTAL ULTRASOUND DOPPLER ULTRASOUND OF THE TESTICLES TECHNIQUE:  Complete ultrasound examination of the testicles, epididymis, and other scrotal structures was performed. Color and spectral Doppler ultrasound were also utilized to evaluate blood flow to the testicles. COMPARISON:  CT of the abdomen and pelvis performed 02/05/2015 FINDINGS: Right testicle Measurements: 3.8 x 2.7 x 3.8 cm. No mass or microlithiasis visualized. Left testicle Status post resection. Right epididymis:  Normal in size and appearance. Left epididymis:  Status post resection. Hydrocele:  None visualized. Varicocele:  None visualized. Pulsed Doppler interrogation of both testes demonstrates normal low resistance arterial and venous waveforms bilaterally. Diffuse soft tissue edema is noted about the scrotum, with soft tissue thickening measuring up to 2.4 cm. IMPRESSION: Diffuse soft tissue edema about the scrotum. The remaining right testis is unremarkable in appearance. No evidence for testicular torsion. Electronically Signed   By: Roanna RaiderJeffery  Chang M.D.   On: 07/17/2015 01:03   [place] Art/ven Flow Abd Pelv Doppler  07/17/2015  CLINICAL DATA:  Acute  onset of right-sided testicular pain for 3 days. Initial encounter. EXAM: SCROTAL ULTRASOUND DOPPLER ULTRASOUND OF THE TESTICLES TECHNIQUE: Complete ultrasound examination of the testicles, epididymis, and other scrotal structures was performed. Color and spectral Doppler ultrasound were also utilized to evaluate blood flow to the testicles. COMPARISON:  CT of the abdomen and pelvis performed 02/05/2015 FINDINGS: Right testicle Measurements: 3.8 x 2.7 x 3.8 cm. No mass or microlithiasis visualized. Left testicle Status post resection. Right epididymis:  Normal in size and appearance. Left epididymis:  Status post resection. Hydrocele:  None visualized. Varicocele:  None visualized. Pulsed Doppler interrogation of both testes demonstrates normal low resistance arterial and venous waveforms bilaterally. Diffuse soft tissue edema is noted about the scrotum, with  soft tissue thickening measuring up to 2.4 cm. IMPRESSION: Diffuse soft tissue edema about the scrotum. The remaining right testis is unremarkable in appearance. No evidence for testicular torsion. Electronically Signed   By: Roanna Raider M.D.   On: 07/17/2015 01:03    2D Echo: none  Cardiac Cath: none  Admission HPI: William Massey is a 29 y.o. 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 Ronne Binning (Urology). Patient states that he was in his usual state of health until this past Wednesday when he had sudden onset of testicular pain and swelling. States it became worse, more red over the next couple of days which prompted him to present to the ED for evaluation. Patient states his testicle feels heavy and fluid filled with pain on the posterior part of his testicle that is exacerbated when sitting down. States he has tried ibuprofen and ice at home which has provided mild relief. States that standing does not cause discomfort. Pain rated as 3/10 currently but as bad as 8-9/10 at its worse. He reports being febrile off and on at home during this time with fever of 101 at home last night and 100.9 this morning. States that in July 2015, patient had his left testicle removed due to infection. States current symptoms do not feel similarly because in 2015 his testicle was much more firm/hard and painful. Otherwise, patient reports no chest pain, shortness of breath, abdominal pain, urinary complaints, nausea or vomiting. He does report mild constipation associated with hard stool, diaphoresis, fevers/chills.  Per chart review, in July 2016 patient had left radical orchiectomy after scrotal ultrasound showed a complex left testicular mass. CT showed a cystic mass replacing the left testis. 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 The mass was removed  intact. No scrotal wall violation per op notes.  In the Emergency Department, patient was febrile to 102.1 with an elevated lactate of 2.3 and leukocytosis of 12.7 (ANC 8.9). Due to this there was concern for sepsis. IV antibiotics were started to cover for necrotizing fasciitis given the concern for infection in the scrotum and blood cultures were obtained. X-ray did not show any obvious evidence of gaseous formation. Dopplers and ultrasound revealed "diffuse soft tissue edema about the scrotum. The remaining right testis is unremarkable in appearance. No evidence for testicular torsion." CT of the pelvis with contrast revealed "Subcutaneous edema over the right perineal region extending inferiorly to the scrotum with diffuse scrotal wall edema/skin thickening. Mild subcutaneous edema adjacent to and anterior lateral to the left spermatic cord above the scrotum. Findings may be due to soft tissue infection. No evidence of air within the  soft tissues to suggest gas-forming infection and no focal abscess." Urinalysis was unremarkable for UTI and showed >1000 glucose. Repeat lactic acid in the ED was 1.1.  Hospital Course by problem list: Principal Problem:   Sepsis (HCC) Active Problems:   Scrotal edema   Type 2 diabetes mellitus (HCC)   Hypertension associated with diabetes (HCC)   Morbid obesity with BMI of 70 and over, adult (HCC)   Scrotal pain   Cellulitis and abscess   Scrotal Cellulitis and Scrotal Abscess: Risk factors for this infection included poorly controlled diabetes and morbid obesity.  Scrotum continued to be swollen and indurated and, overall, was slow to respond to IV antibiotics.  Urology was consulted and was able to perform I&D at the bedside.  This revealed a moderate amount of purulent drainage and abscess.  Wound cultures from this abscess grew Group B strep.  Blood cultures were no growth on final report.  Patient was discharged home with TMP-SMX for 14 days from source  control (last day of treatment 08/02/15.  He was instructed to follow up with Urology and his primary care doctor in [place].  He was also ordered a home health RN to assist with daily packing changes.    DM2: Patients diabetes is poorly controlled with a Hemoglobin A1C of 10.6 obtained on admission.  Patient states that he is non-compliant with his Metformin most days due to GI upset.  We achieved glycemic control with him while in the hospital on Lantus 30 units qhs plus sliding scale insulin and meal time coverage with Novolog.  At discharge, we prescribed Glipizide and provided referral to Diabetes Educator.  He will need close outpatient follow up with PCP for DM management and we encouraged him to do so.  HTN: At admission, patient stated he was on a fluid pill, but was unsure as to which one.  He thought it was HCTZ.  His BP was stable throughout admission and BP meds were held.  At discharge, we decided that HCTZ may not be the best agent for his HTN with potential for hyperglycemia.  In addition, his uncontrolled diabetes warrants being on an ACE inhibitor.  He was thus discharged home on Lisinopril and asked to follow up with his PCP.  At hospital follow up, he will need a BMP after initiating ACE inhibitor therapy.  Morbid Obesity: Patient would benefit from nutritional and dietary counseling for his obesity and weight.  Referral for Diabetes Educator was placed at discharge.  Discharge Vitals:   BP 179/98 mmHg  Pulse 102  Temp(Src) 97.5 F (36.4 C) (Oral)  Resp 20  Ht 6\' 1"  (1.854 m)  Wt 548 lb 4 oz (248.685 kg)  BMI 72.35 kg/m2  SpO2 89%  Discharge Labs:  Results for orders placed or performed during the hospital encounter of 07/16/15 (from the past 24 hour(s))  Glucose, capillary     Status: Abnormal   Collection Time: 07/20/15 12:42 PM  Result Value Ref Range   Glucose-Capillary 200 (H) 65 - 99 mg/dL   Comment 1 Notify RN   Vancomycin, trough     Status: Abnormal    Collection Time: 07/20/15  2:25 PM  Result Value Ref Range   Vancomycin Tr 9 (L) 10.0 - 20.0 ug/mL  Glucose, capillary     Status: Abnormal   Collection Time: 07/20/15  5:03 PM  Result Value Ref Range   Glucose-Capillary 211 (H) 65 - 99 mg/dL   Comment 1 Notify RN   Glucose, capillary  Status: Abnormal   Collection Time: 07/20/15  8:54 PM  Result Value Ref Range   Glucose-Capillary 195 (H) 65 - 99 mg/dL  CBC with Differential/Platelet     Status: Abnormal   Collection Time: 07/21/15  3:42 AM  Result Value Ref Range   WBC 5.2 4.0 - 10.5 K/uL   RBC 4.57 4.22 - 5.81 MIL/uL   Hemoglobin 12.7 (L) 13.0 - 17.0 g/dL   HCT 16.1 09.6 - 04.5 %   MCV 86.7 78.0 - 100.0 fL   MCH 27.8 26.0 - 34.0 pg   MCHC 32.1 30.0 - 36.0 g/dL   RDW 40.9 81.1 - 91.4 %   Platelets 195 150 - 400 K/uL   Neutrophils Relative % 50 %   Neutro Abs 2.6 1.7 - 7.7 K/uL   Lymphocytes Relative 37 %   Lymphs Abs 1.9 0.7 - 4.0 K/uL   Monocytes Relative 8 %   Monocytes Absolute 0.4 0.1 - 1.0 K/uL   Eosinophils Relative 5 %   Eosinophils Absolute 0.3 0.0 - 0.7 K/uL   Basophils Relative 0 %   Basophils Absolute 0.0 0.0 - 0.1 K/uL  Glucose, capillary     Status: Abnormal   Collection Time: 07/21/15  7:20 AM  Result Value Ref Range   Glucose-Capillary 198 (H) 65 - 99 mg/dL    Signed: Gwynn Burly, DO 07/21/2015, 11:45 AM    Services Ordered on Discharge: home health RN Equipment Ordered on Discharge: none

## 2015-07-22 LAB — WOUND CULTURE

## 2015-07-22 LAB — CULTURE, BLOOD (ROUTINE X 2)
CULTURE: NO GROWTH
Culture: NO GROWTH

## 2015-12-02 DIAGNOSIS — E1165 Type 2 diabetes mellitus with hyperglycemia: Secondary | ICD-10-CM | POA: Diagnosis not present

## 2015-12-02 DIAGNOSIS — E7800 Pure hypercholesterolemia, unspecified: Secondary | ICD-10-CM | POA: Diagnosis not present

## 2016-03-07 DIAGNOSIS — E1165 Type 2 diabetes mellitus with hyperglycemia: Secondary | ICD-10-CM | POA: Diagnosis not present

## 2016-03-07 DIAGNOSIS — E7800 Pure hypercholesterolemia, unspecified: Secondary | ICD-10-CM | POA: Diagnosis not present

## 2016-04-13 IMAGING — CR DG CHEST 2V
2 series · 2 of 2 positions shown · non-contrast
Comparison: None.

CLINICAL DATA: Asthma, diabetes and hypertension. Preoperative
evaluation for disease of seminal vesicle.

EXAM:
CHEST  2 VIEW

[w chest lat * (1 of 2)]
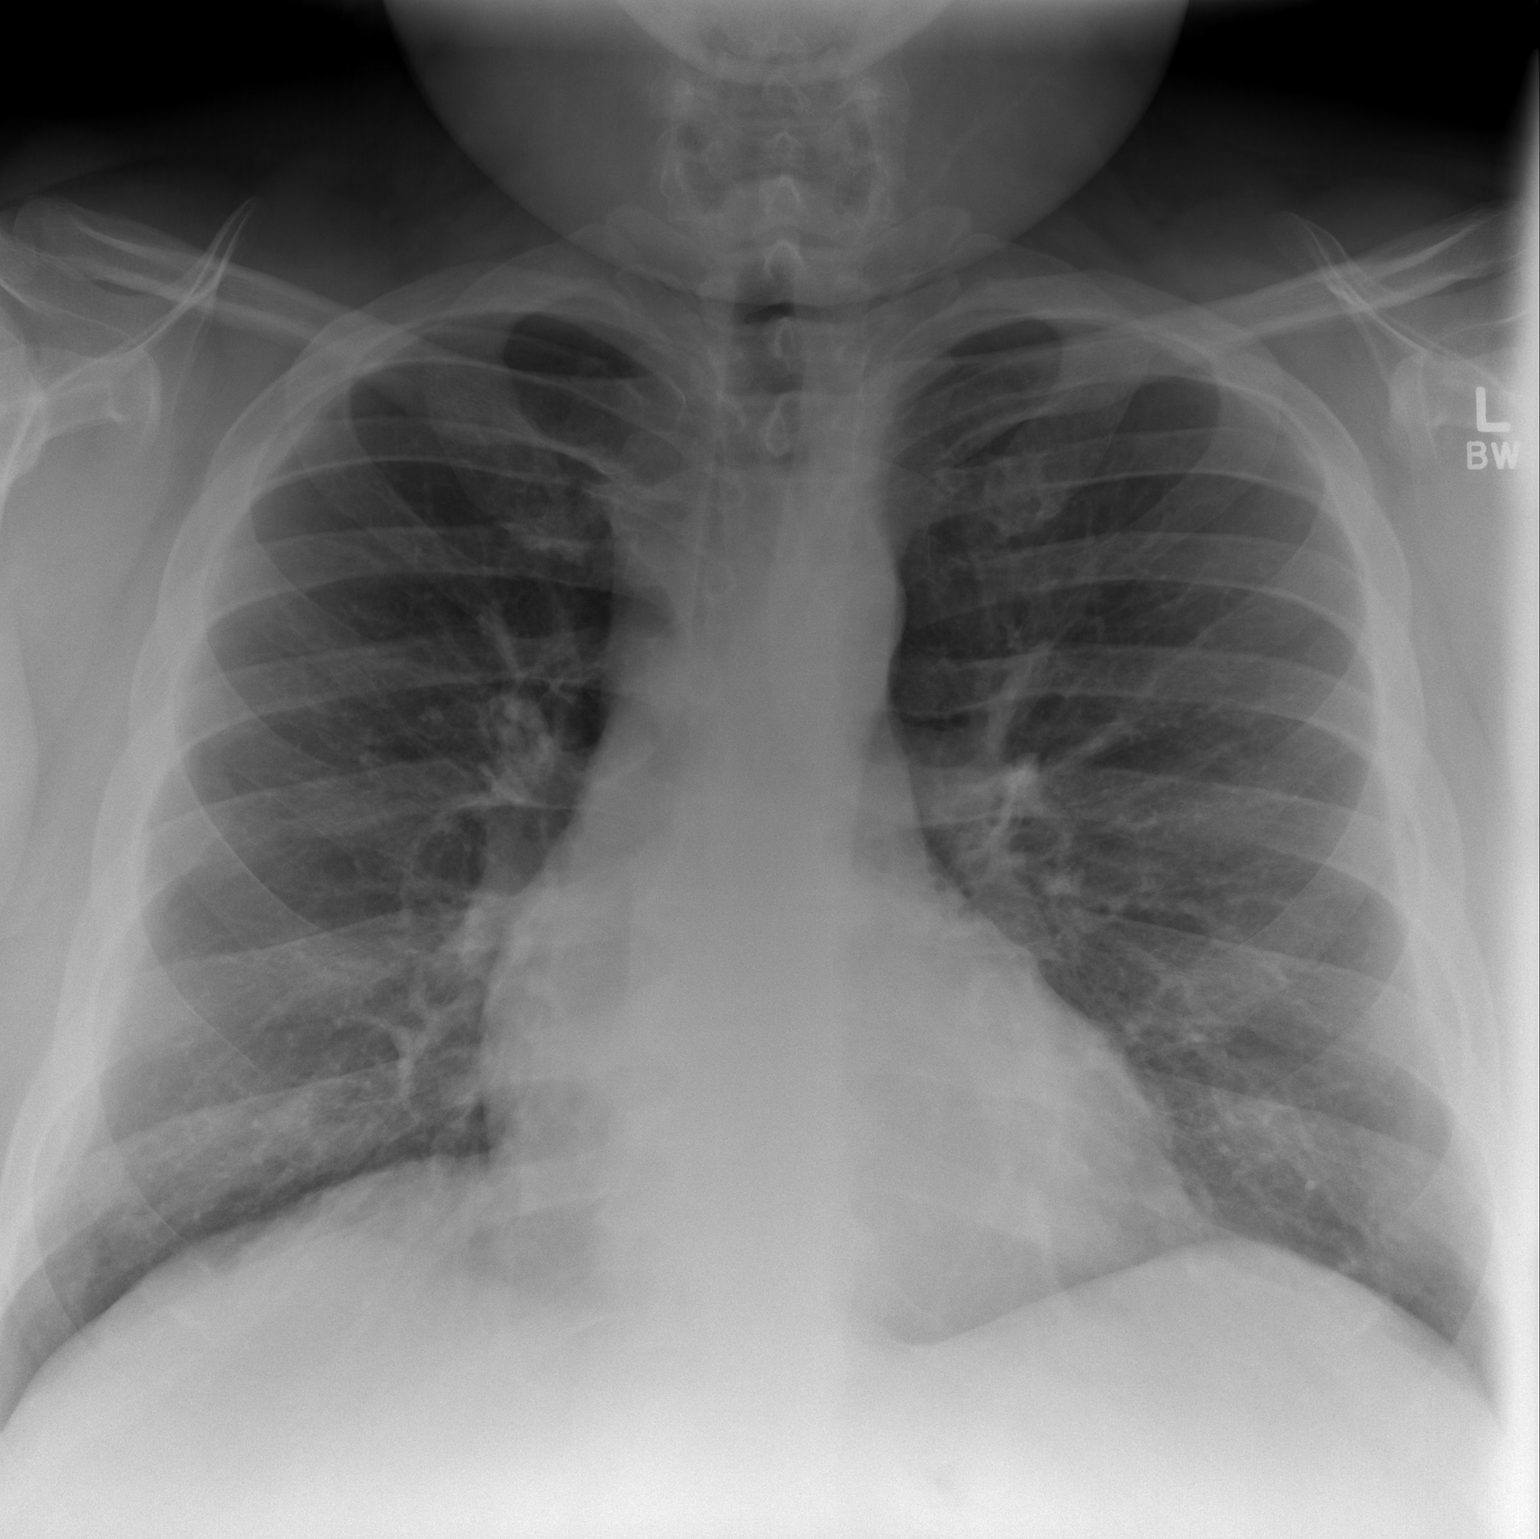

[w chest lat * (2 of 2)]
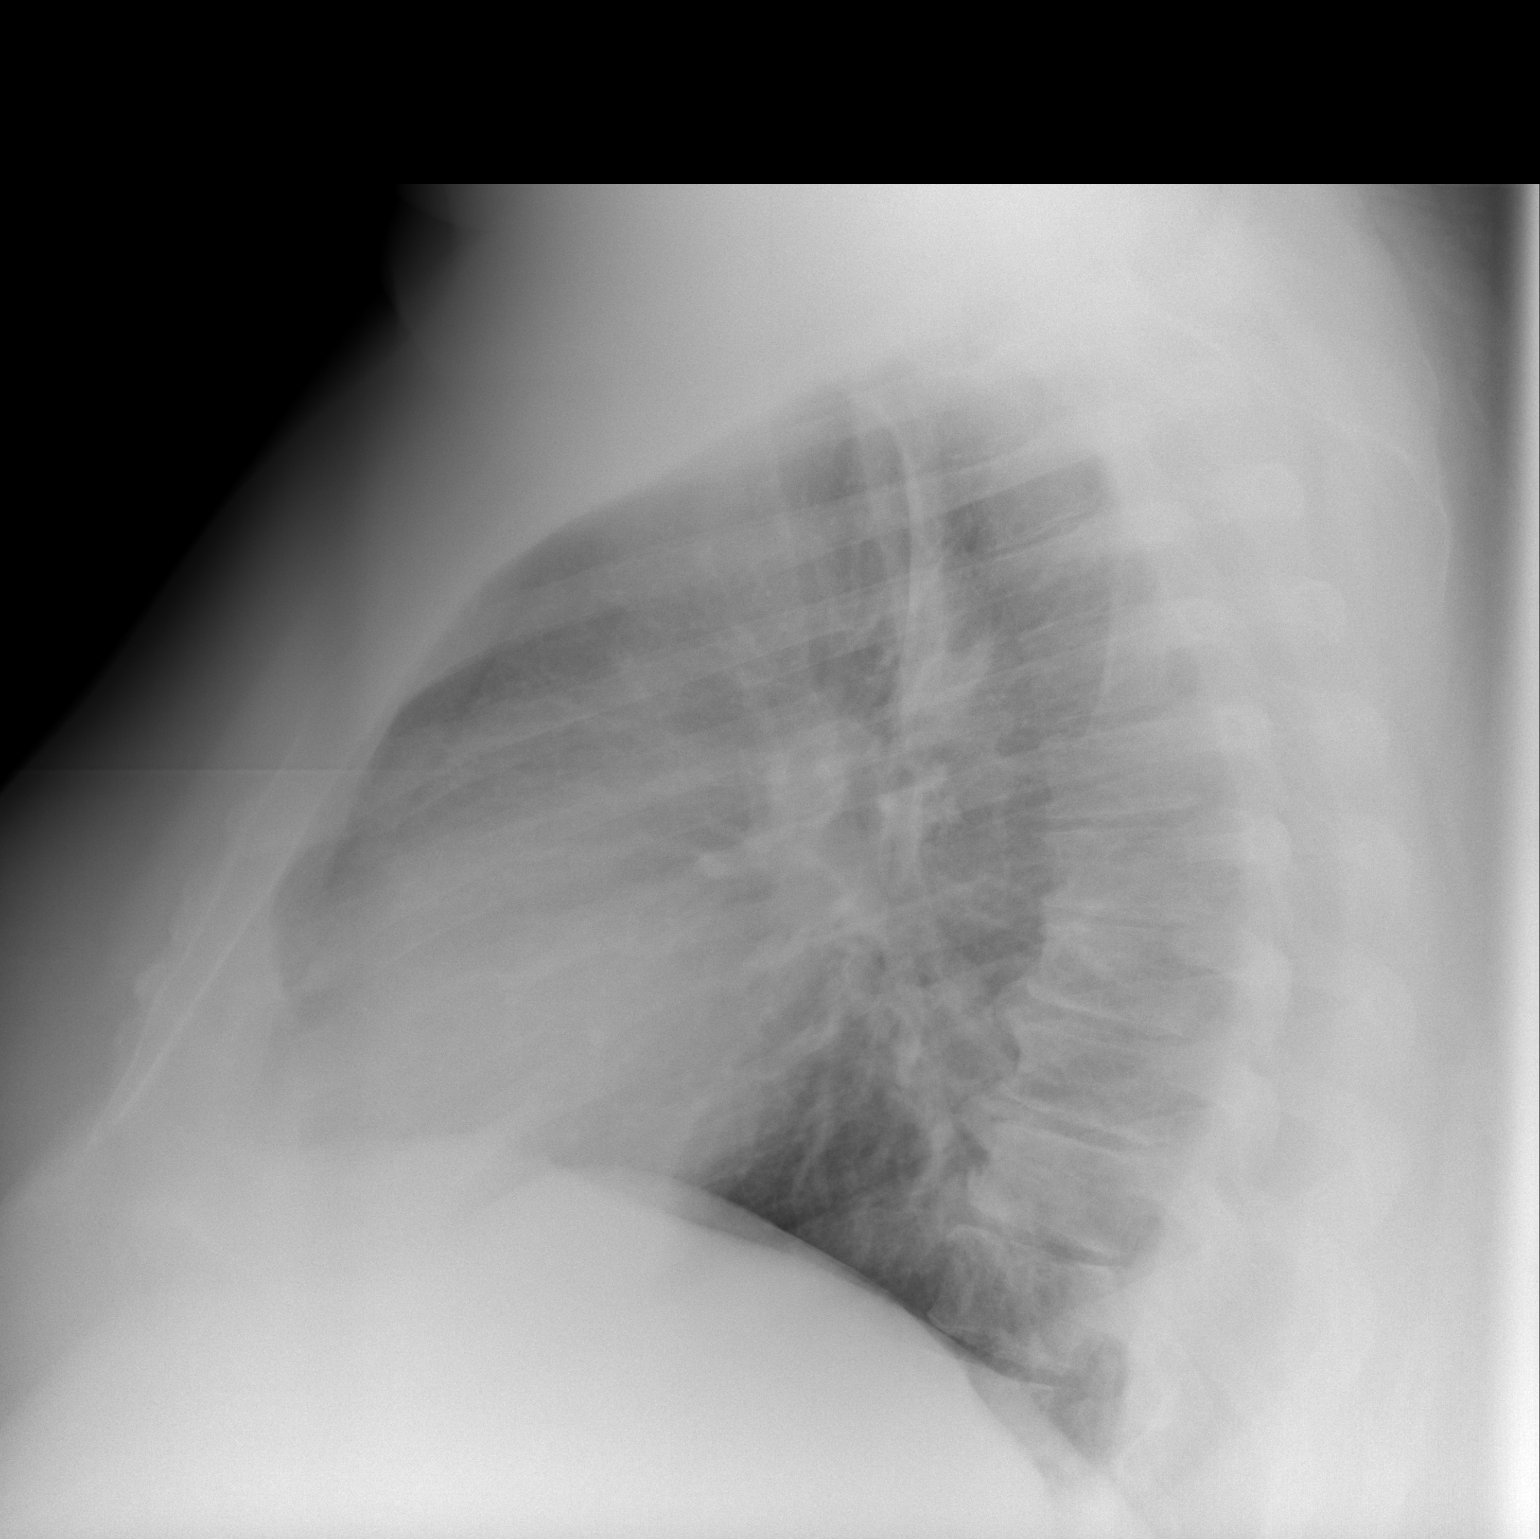

[2 of 2 positions shown; findings below may reference images not displayed]

FINDINGS: The heart size and mediastinal contours are within normal limits.
Both lungs are clear. The visualized skeletal structures are
unremarkable except for mild mid thoracic spondylosis and
degenerative change.
IMPRESSION: No active cardiopulmonary disease.

## 2016-09-15 IMAGING — CT CT PELVIS W/ CM
2 of 3 series · 13 of 32 positions shown, 19 images · IV contrast (omnipaque)
Comparison: None.

CLINICAL DATA: Surgery 02/19/2015 left orchectomy due to 10 cm
cystic mass. Continued pain/swelling to right testicle. Four rounds
of antibiotics without improvement. Evaluate for necrotizing
fasciitis.

EXAM:
CT PELVIS WITH CONTRAST
TECHNIQUE: Multidetector CT imaging of the pelvis was performed using the
standard protocol following the bolus administration of intravenous
contrast.
CONTRAST:  100mL OMNIPAQUE IOHEXOL 300 MG/ML  SOLN

[Series 2: pelvis 5.0 i31f 1 · axial · 0.98mm/px · z∈[+817,+1075]mm · 11 of 63 slices shown, 17 images (1 of 2)]
[im 6/63  soft-tissue]
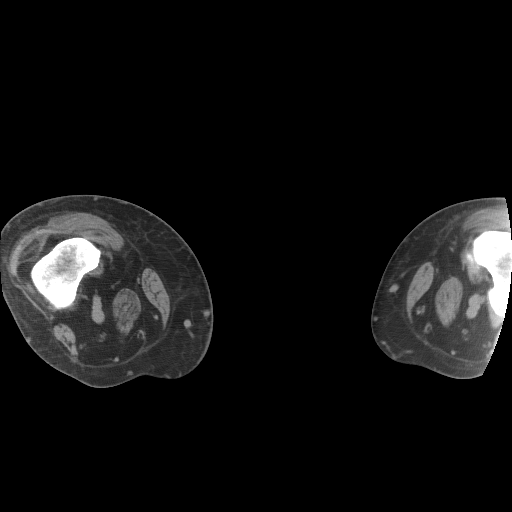
[im 6/63  bone]
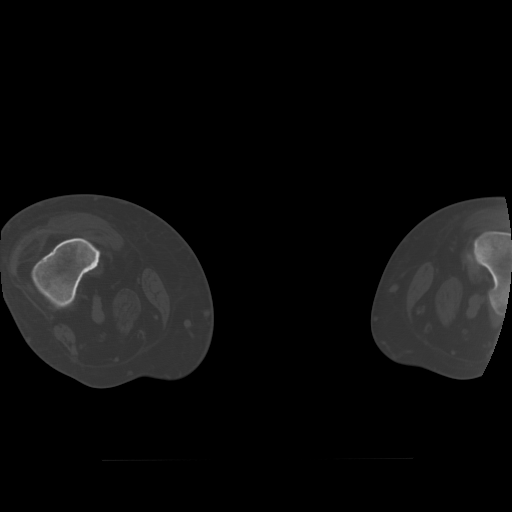
[im 11/63  soft-tissue]
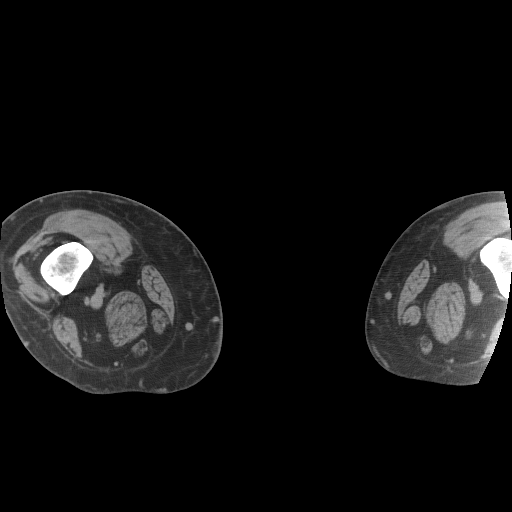
[im 16/63  soft-tissue]
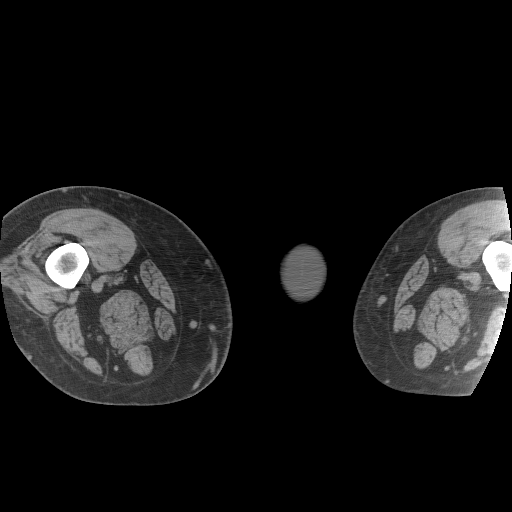
[im 21/63  soft-tissue]
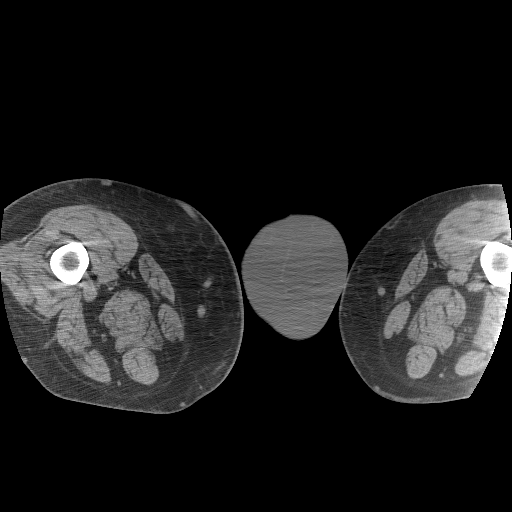
[im 26/63  soft-tissue]
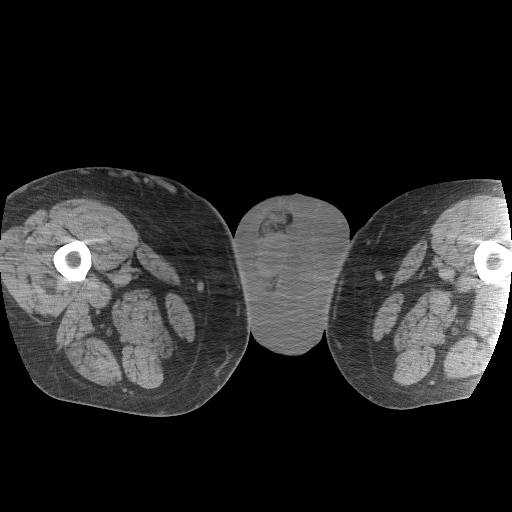
[im 32/63  soft-tissue]
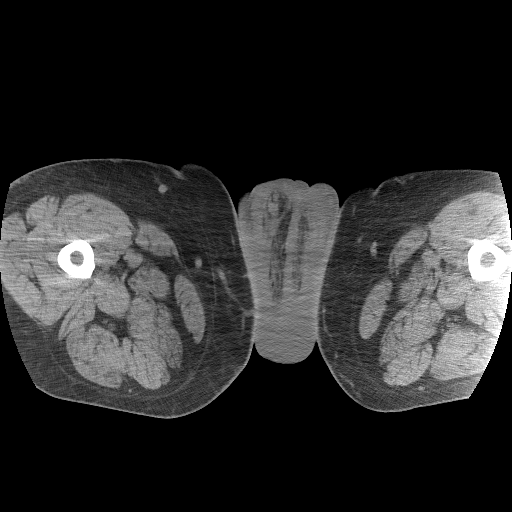
[im 37/63  soft-tissue]
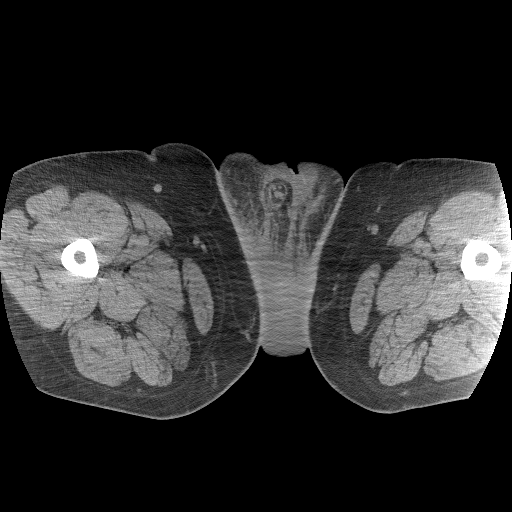
[im 42/63  soft-tissue]
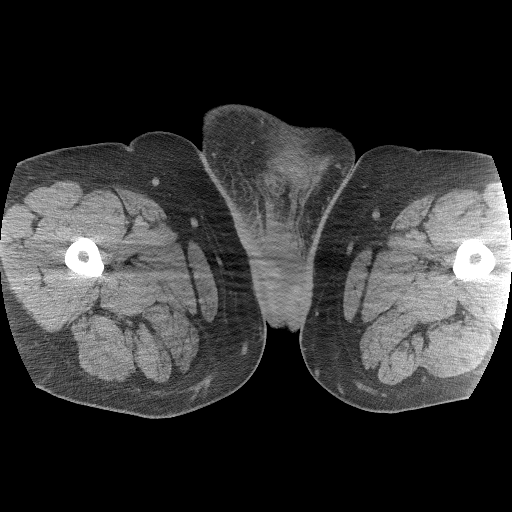
[im 42/63  lung]
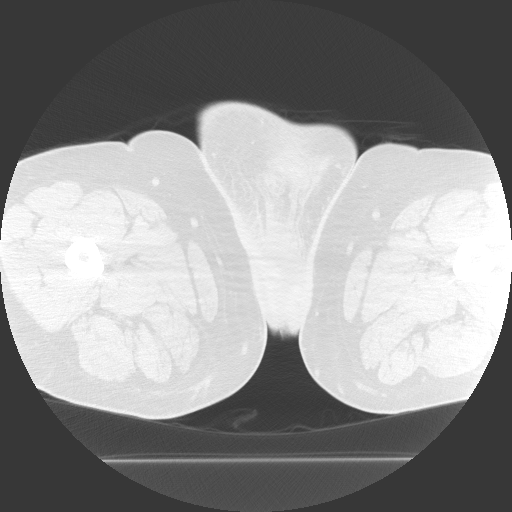
[im 47/63  soft-tissue]
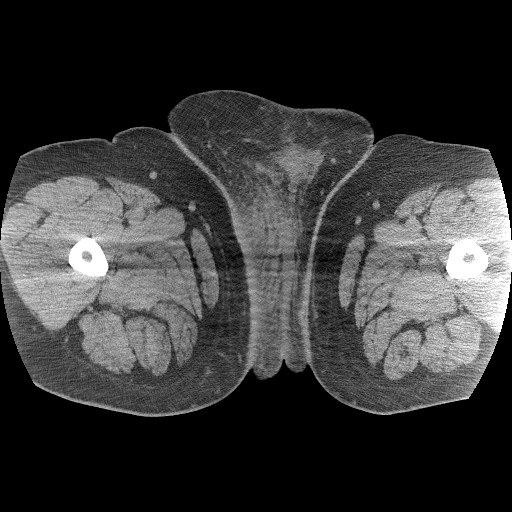
[im 47/63  lung]
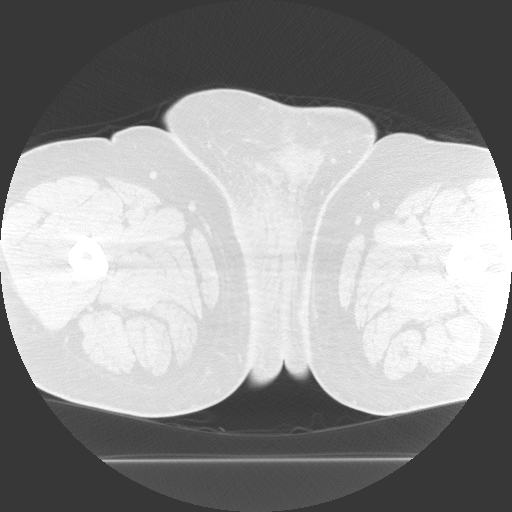
[im 47/63  bone]
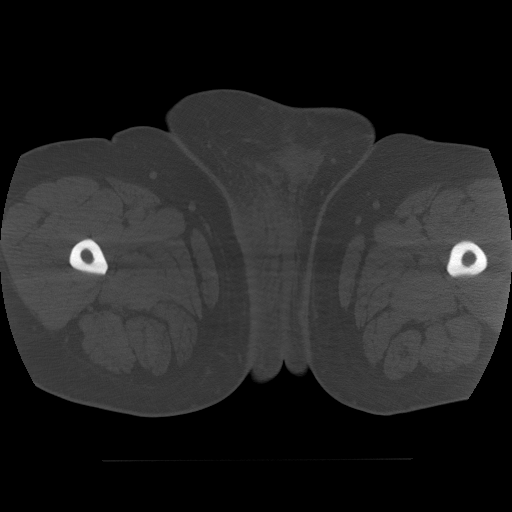
[im 52/63  soft-tissue]
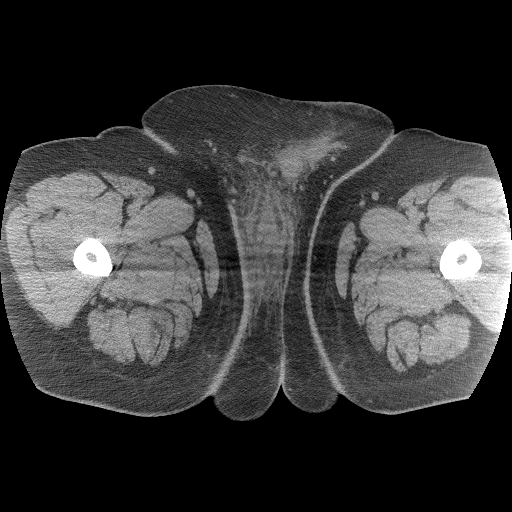
[im 52/63  lung]
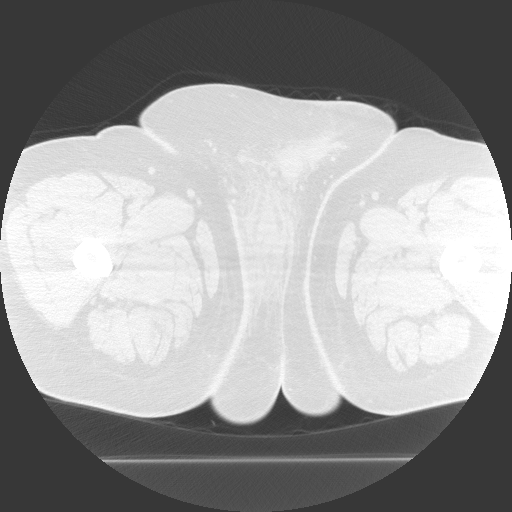
[im 57/63  soft-tissue]
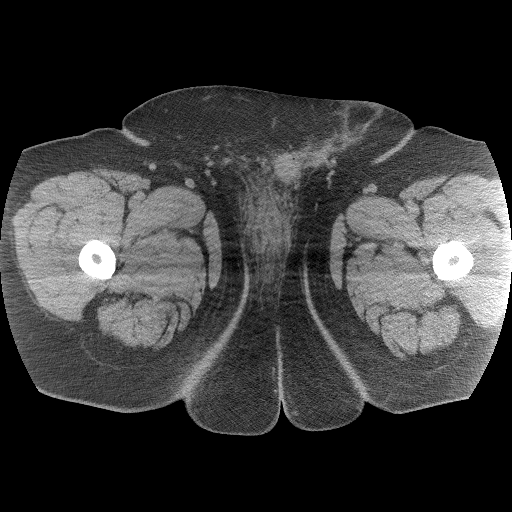
[im 57/63  lung]
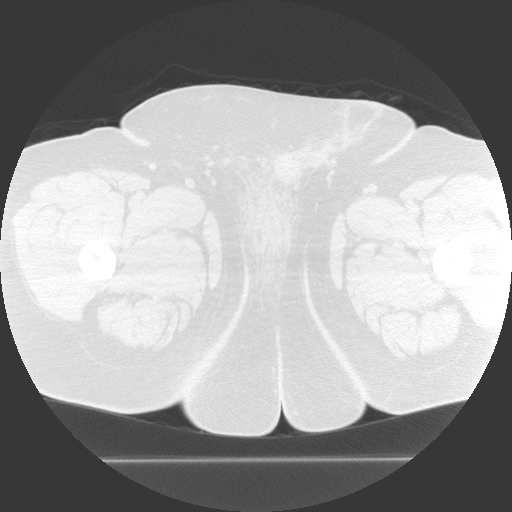

[Series 11: pelvis 5.0 i31f 1 · axial · 0.98mm/px · z∈[+1132,+1162]mm · 2 of 62 slices shown (2 of 2)]
[im 6/62  soft-tissue]
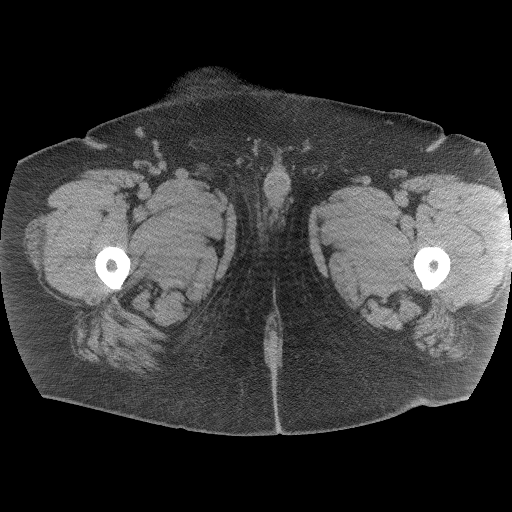
[im 12/62  soft-tissue]
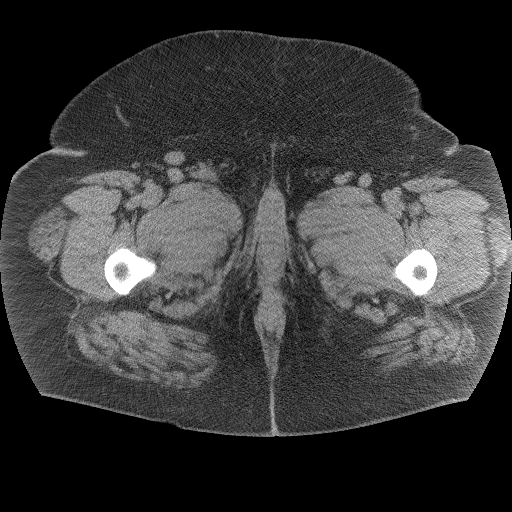

[13 of 32 positions shown; findings below may reference images not displayed]

FINDINGS: Appendix is normal. The bladder, prostate and rectum are within
normal. No evidence of free pelvic fluid. A few small bilateral
symmetrical superficial inguinal lymph nodes.

There is moderate scrotal wall edema/skin thickening with
subcutaneous edema within the right peroneal region. There is also
mild subcutaneous edema anterior lateral and adjacent to the left
spermatic cord above the scrotum. No air within the soft tissues. No
focal abscess visualized. Remaining bones soft tissues are
unremarkable.
IMPRESSION: Subcutaneous edema over the right perineal region extending
inferiorly to the scrotum with diffuse scrotal wall edema/skin
thickening. Mild subcutaneous edema adjacent to and anterior lateral
to the left spermatic cord above the scrotum. Findings may be due to
soft tissue infection. No evidence of air within the soft tissues to
suggest gas-forming infection and no focal abscess.

## 2017-11-05 DIAGNOSIS — E119 Type 2 diabetes mellitus without complications: Secondary | ICD-10-CM | POA: Diagnosis not present

## 2017-11-05 DIAGNOSIS — I10 Essential (primary) hypertension: Secondary | ICD-10-CM | POA: Diagnosis not present

## 2017-11-05 DIAGNOSIS — J00 Acute nasopharyngitis [common cold]: Secondary | ICD-10-CM | POA: Diagnosis not present

## 2017-11-19 DIAGNOSIS — I10 Essential (primary) hypertension: Secondary | ICD-10-CM | POA: Diagnosis not present

## 2017-12-03 DIAGNOSIS — I10 Essential (primary) hypertension: Secondary | ICD-10-CM | POA: Diagnosis not present

## 2018-01-01 DIAGNOSIS — I10 Essential (primary) hypertension: Secondary | ICD-10-CM | POA: Diagnosis not present

## 2018-02-26 DIAGNOSIS — E119 Type 2 diabetes mellitus without complications: Secondary | ICD-10-CM | POA: Diagnosis not present

## 2018-02-26 DIAGNOSIS — J4520 Mild intermittent asthma, uncomplicated: Secondary | ICD-10-CM | POA: Diagnosis not present

## 2018-02-26 DIAGNOSIS — I10 Essential (primary) hypertension: Secondary | ICD-10-CM | POA: Diagnosis not present

## 2018-05-29 DIAGNOSIS — Z6841 Body Mass Index (BMI) 40.0 and over, adult: Secondary | ICD-10-CM | POA: Diagnosis not present

## 2018-05-29 DIAGNOSIS — E1169 Type 2 diabetes mellitus with other specified complication: Secondary | ICD-10-CM | POA: Diagnosis not present

## 2018-05-29 DIAGNOSIS — E119 Type 2 diabetes mellitus without complications: Secondary | ICD-10-CM | POA: Diagnosis not present

## 2018-05-29 DIAGNOSIS — I10 Essential (primary) hypertension: Secondary | ICD-10-CM | POA: Diagnosis not present

## 2018-09-02 DIAGNOSIS — I10 Essential (primary) hypertension: Secondary | ICD-10-CM | POA: Diagnosis not present

## 2018-09-02 DIAGNOSIS — Z6841 Body Mass Index (BMI) 40.0 and over, adult: Secondary | ICD-10-CM | POA: Diagnosis not present

## 2018-09-02 DIAGNOSIS — E1169 Type 2 diabetes mellitus with other specified complication: Secondary | ICD-10-CM | POA: Diagnosis not present

## 2018-10-16 DIAGNOSIS — R509 Fever, unspecified: Secondary | ICD-10-CM | POA: Diagnosis not present

## 2018-10-16 DIAGNOSIS — J00 Acute nasopharyngitis [common cold]: Secondary | ICD-10-CM | POA: Diagnosis not present

## 2019-01-24 DIAGNOSIS — E785 Hyperlipidemia, unspecified: Secondary | ICD-10-CM | POA: Diagnosis not present

## 2019-01-24 DIAGNOSIS — R079 Chest pain, unspecified: Secondary | ICD-10-CM | POA: Diagnosis not present

## 2019-01-24 DIAGNOSIS — R0602 Shortness of breath: Secondary | ICD-10-CM | POA: Diagnosis not present

## 2019-01-24 DIAGNOSIS — Z6841 Body Mass Index (BMI) 40.0 and over, adult: Secondary | ICD-10-CM | POA: Diagnosis not present

## 2019-01-24 DIAGNOSIS — I10 Essential (primary) hypertension: Secondary | ICD-10-CM | POA: Diagnosis not present

## 2019-01-24 DIAGNOSIS — E781 Pure hyperglyceridemia: Secondary | ICD-10-CM | POA: Diagnosis not present

## 2019-01-24 DIAGNOSIS — R9431 Abnormal electrocardiogram [ECG] [EKG]: Secondary | ICD-10-CM | POA: Diagnosis not present

## 2019-01-24 DIAGNOSIS — E1165 Type 2 diabetes mellitus with hyperglycemia: Secondary | ICD-10-CM | POA: Diagnosis not present

## 2019-01-24 DIAGNOSIS — R739 Hyperglycemia, unspecified: Secondary | ICD-10-CM | POA: Diagnosis not present

## 2019-01-30 DIAGNOSIS — R072 Precordial pain: Secondary | ICD-10-CM | POA: Diagnosis not present

## 2019-01-30 DIAGNOSIS — E782 Mixed hyperlipidemia: Secondary | ICD-10-CM | POA: Diagnosis not present

## 2019-01-30 DIAGNOSIS — E1169 Type 2 diabetes mellitus with other specified complication: Secondary | ICD-10-CM | POA: Diagnosis not present

## 2019-05-13 DIAGNOSIS — F411 Generalized anxiety disorder: Secondary | ICD-10-CM | POA: Diagnosis not present

## 2019-05-13 DIAGNOSIS — E1169 Type 2 diabetes mellitus with other specified complication: Secondary | ICD-10-CM | POA: Diagnosis not present

## 2019-05-13 DIAGNOSIS — E782 Mixed hyperlipidemia: Secondary | ICD-10-CM | POA: Diagnosis not present

## 2019-05-13 DIAGNOSIS — I10 Essential (primary) hypertension: Secondary | ICD-10-CM | POA: Diagnosis not present

## 2019-08-26 DIAGNOSIS — E782 Mixed hyperlipidemia: Secondary | ICD-10-CM | POA: Diagnosis not present

## 2019-08-26 DIAGNOSIS — E1169 Type 2 diabetes mellitus with other specified complication: Secondary | ICD-10-CM | POA: Diagnosis not present

## 2019-08-26 DIAGNOSIS — I10 Essential (primary) hypertension: Secondary | ICD-10-CM | POA: Diagnosis not present

## 2019-08-26 DIAGNOSIS — F411 Generalized anxiety disorder: Secondary | ICD-10-CM | POA: Diagnosis not present

## 2019-10-20 ENCOUNTER — Other Ambulatory Visit: Payer: Self-pay | Admitting: Physician Assistant

## 2019-11-27 ENCOUNTER — Other Ambulatory Visit: Payer: Self-pay

## 2019-11-27 ENCOUNTER — Encounter: Payer: Self-pay | Admitting: Physician Assistant

## 2019-11-27 ENCOUNTER — Ambulatory Visit: Payer: BLUE CROSS/BLUE SHIELD | Admitting: Physician Assistant

## 2019-11-27 VITALS — BP 142/84 | HR 90 | Temp 97.3°F | Ht 74.0 in | Wt >= 6400 oz

## 2019-11-27 DIAGNOSIS — E1169 Type 2 diabetes mellitus with other specified complication: Secondary | ICD-10-CM

## 2019-11-27 DIAGNOSIS — E782 Mixed hyperlipidemia: Secondary | ICD-10-CM | POA: Diagnosis not present

## 2019-11-27 DIAGNOSIS — I10 Essential (primary) hypertension: Secondary | ICD-10-CM | POA: Diagnosis not present

## 2019-11-27 DIAGNOSIS — E1159 Type 2 diabetes mellitus with other circulatory complications: Secondary | ICD-10-CM | POA: Diagnosis not present

## 2019-11-27 DIAGNOSIS — I152 Hypertension secondary to endocrine disorders: Secondary | ICD-10-CM

## 2019-11-27 LAB — POCT UA - MICROALBUMIN: Microalbumin Ur, POC: NEGATIVE mg/L

## 2019-11-27 LAB — POCT URINALYSIS DIPSTICK
Bilirubin, UA: NEGATIVE
Blood, UA: NEGATIVE
Glucose, UA: POSITIVE — AB
Leukocytes, UA: NEGATIVE
Nitrite, UA: NEGATIVE
Protein, UA: NEGATIVE
Spec Grav, UA: 1.02 (ref 1.010–1.025)
Urobilinogen, UA: NEGATIVE E.U./dL — AB
pH, UA: 5.5 (ref 5.0–8.0)

## 2019-11-27 MED ORDER — JARDIANCE 25 MG PO TABS: 25.0000 mg | ORAL_TABLET | Freq: Every morning | ORAL | 1 refills | Status: DC

## 2019-11-27 NOTE — Assessment & Plan Note (Signed)
Well controlled.  ?No changes to medicines.  ?Continue to work on eating a healthy diet and exercise.  ?Labs drawn today.  ?

## 2019-11-27 NOTE — Progress Notes (Signed)
Established Patient Office Visit  Subjective:  Patient ID: William Massey, male    DOB: 07/18/1986  Age: 34 y.o. MRN: [id number]  CC:  Chief Complaint  Patient presents with  . Diabetes    3 months follow up  . Hypertension  . Hyperlipidemia    HPI William Massey presents for follow up diabetes     Patient presents with type 2 diabetes mellitus with other specified complication.  Specifically, this is type 2, non-insulin requiring diabetes, complicated by hypertension, obesity, and hyperlipidemia.  Date of diagnosis 2014.  Compliance with treatment has been fair; he skips some medication doses due to side effects.  He specifically denies associated symptoms, including blurred vision, fatigue, headache, hypoglycemia, nocturia, peripheral neuropathy, excessive thirst and frequent urination.  Depression screen is performed and is negative.   Tobacco screen: Non-smoker.  Current meds include an oral hypoglycemic ( Jardiance 25 mg QD ), insulin/injectable ( Trulicity 1.5 mg injection once a week ),  Lotensin, and lipid lowering agent ( Lipitor ).  He reports home blood glucose readings have been fairly good, with average fasting glucoses running in the 120-150 mg/dL range but occasionally has elevated readings in the mornings.  Concurrent health problems include hypertension and hypercholesterolemia.      Pt presents for follow up of hypertension.  Date of diagnosis 2014.  His current cardiac medication regimen includes a diuretic ( HCTZ 25 mg QD ), a beta-blocker ( Metoprolol ER 200 mg QD ), and lotrel 10/40 He has not kept a blood pressure diary, but states that typical readings show systolics in the 140-160 range and diastolics in the 90-100 range.  He is tolerating the medication well without side effects.  Compliance with treatment has been fair; he does not follow a diet and exercise regimen and PT UNSURE EXACTLY WHICH MEDICATIONS HE IS TAKING AT THIS TIME.  Concurrent health problems  include hyperlipidemia and diabetes.  ALSO AT LAST VISIT William JOHNSON PA PLACED HIM ON ADIPEX WHICH HE IS TOLD NOT TO TAKE ANY MORE BECAUSE HE SHOULD NOT BE ON THIS MEDICATION BECAUSE OF HIS HYPERTENSION    Pt presents with hyperlipidemia.  Date of diagnosis 01/24/2019.  Current treatment includes Lipitor.  Compliance with treatment has been good; he takes his medication as directed, maintains his low cholesterol diet, and follows up as directed.  He denies experiencing any hypercholesterolemia related symptoms.  Concurrent health problems include hypertension and diabetes.       Past Medical History:  Diagnosis Date  . Asthma   . Diabetes mellitus without complication (HCC)   . Generalized anxiety disorder   . Hypertension   . Mild intermittent asthma, uncomplicated   . Mixed hyperlipidemia   . Morbid (severe) obesity due to excess calories (HCC)   . Personal history of malignant neoplasm of testis     Past Surgical History:  Procedure Laterality Date  . ORCHIECTOMY Left 02/19/2015   Procedure: RADICAL ORCHIECTOMY;  Surgeon: Malen Gauze, MD;  Location: [facility];  Service: Urology;  Laterality: Left;    Family History  Problem Relation Age of Onset  . Hypertension Father   . Diabetes type II Father   . Leukemia Brother     Social History   Socioeconomic History  . Marital status: Single    Spouse name: Not on file  . Number of children: 1  . Years of education: Not on file  . Highest education level: Not on file  Occupational History  . Not on  file  Tobacco Use  . Smoking status: Never Smoker  . Smokeless tobacco: Never Used  Substance and Sexual Activity  . Alcohol use: Not on file    Comment: Drinks alcohol very infrequently. He typically consumes beer  . Drug use: Yes    Frequency: 0.2 times per week    Types: Marijuana  . Sexual activity: Not on file  Other Topics Concern  . Not on file  Social History Narrative  . Not on file   Social Determinants of  Health   Financial Resource Strain:   . Difficulty of Paying Living Expenses:   Food Insecurity:   . Worried About Programme researcher, broadcasting/film/video in the Last Year:   . Barista in the Last Year:   Transportation Needs:   . Freight forwarder (Medical):   Marland Kitchen Lack of Transportation (Non-Medical):   Physical Activity:   . Days of Exercise per Week:   . Minutes of Exercise per Session:   Stress:   . Feeling of Stress :   Social Connections:   . Frequency of Communication with Friends and Family:   . Frequency of Social Gatherings with Friends and Family:   . Attends Religious Services:   . Active Member of Clubs or Organizations:   . Attends Banker Meetings:   Marland Kitchen Marital Status:   Intimate Partner Violence:   . Fear of Current or Ex-Partner:   . Emotionally Abused:   Marland Kitchen Physically Abused:   . Sexually Abused:      Current Outpatient Medications:  .  albuterol (VENTOLIN HFA) 108 (90 Base) MCG/ACT inhaler, Inhale 1-2 puffs into the lungs every 6 (six) hours as needed for wheezing or shortness of breath., Disp: , Rfl:  .  amLODipine-benazepril (LOTREL) 10-40 MG capsule, Take 1 capsule by mouth daily., Disp: , Rfl:  .  atorvastatin (LIPITOR) 20 MG tablet, Take 20 mg by mouth daily., Disp: , Rfl:  .  hydrochlorothiazide (HYDRODIURIL) 25 MG tablet, Take 25 mg by mouth daily., Disp: , Rfl:  .  JARDIANCE 25 MG TABS tablet, Take 25 mg by mouth every morning., Disp: , Rfl:  .  metoprolol (TOPROL-XL) 200 MG 24 hr tablet, Take 200 mg by mouth daily., Disp: , Rfl:  .  pioglitazone (ACTOS) 30 MG tablet, TAKE 1 TABLET BY MOUTH ONCE DAILY, Disp: 30 tablet, Rfl: 1 .  TRULICITY 1.5 MG/0.5ML SOPN, Inject 1.5 mg as directed once a week., Disp: , Rfl:    Allergies  Allergen Reactions  . Metformin And Related Nausea Only    ROS CONSTITUTIONAL: Negative for chills, fatigue, fever, unintentional weight gain and unintentional weight loss.  E/N/T: Negative for ear pain, nasal congestion  and sore throat.  CARDIOVASCULAR: Negative for chest pain, dizziness, palpitations and pedal edema.  RESPIRATORY: Negative for recent cough and dyspnea.  GASTROINTESTINAL: Negative for abdominal pain, acid reflux symptoms, constipation, diarrhea, nausea and vomiting.  MSK: Negative for arthralgias and myalgias.  INTEGUMENTARY: Negative for rash.  NEUROLOGICAL: Negative for dizziness and headaches.  PSYCHIATRIC: Negative for sleep disturbance and to question depression screen.  Negative for depression, negative for anhedonia.        Objective:    PHYSICAL EXAM:   VS: BP (!) 142/84 (BP Location: Left Arm, Patient Position: Sitting)   Pulse 90   Temp (!) 97.3 F (36.3 C) (Temporal)   Ht 6\' 2"  (1.88 m)   Wt (!) 503 lb (228.2 kg)   SpO2 97%   BMI 64.58  kg/m   GEN: Well nourished, well developed, in no acute distress morbidly obese  Cardiac: RRR; no murmurs, rubs, or gallops,no edema - no significant varicosities Respiratory:  normal respiratory rate and pattern with no distress - normal breath sounds with no rales, rhonchi, wheezes or rubs  MS: no deformity or atrophy  Skin: warm and dry, no rash  Neuro:  Alert and Oriented x 3, Strength and sensation are intact - CN II-Xii grossly intact Psych: euthymic mood, appropriate affect and demeanor  Office Visit on 11/27/2019  Component Date Value Ref Range Status  . Microalbumin Ur, POC 11/27/2019 negative  mg/L Final  . Glucose, UA 11/27/2019 Positive* Negative Final  . Bilirubin, UA 11/27/2019 negative   Final  . Ketones, UA 11/27/2019 1+   Final  . Spec Grav, UA 11/27/2019 1.020  1.010 - 1.025 Final  . Blood, UA 11/27/2019 negative   Final  . pH, UA 11/27/2019 5.5  5.0 - 8.0 Final  . Protein, UA 11/27/2019 Negative  Negative Final  . Urobilinogen, UA 11/27/2019 negative* 0.2 or 1.0 E.U./dL Final  . Nitrite, UA 11/27/2019 negative   Final  . Leukocytes, UA 11/27/2019 Negative  Negative Final    BP (!) 142/84 (BP Location:  Left Arm, Patient Position: Sitting)   Pulse 90   Temp (!) 97.3 F (36.3 C) (Temporal)   Ht 6\' 2"  (1.88 m)   Wt (!) 503 lb (228.2 kg)   SpO2 97%   BMI 64.58 kg/m  Wt Readings from Last 3 Encounters:  11/27/19 (!) 503 lb (228.2 kg)  07/17/15 (!) 548 lb 4 oz (248.7 kg)  02/19/15 (!) 548 lb (248.6 kg)     Health Maintenance Due  Topic Date Due  . FOOT EXAM  Never done  . OPHTHALMOLOGY EXAM  Never done  . HEMOGLOBIN A1C  01/15/2016    There are no preventive care reminders to display for this patient.  No results found for: TSH Lab Results  Component Value Date   WBC 5.2 07/21/2015   HGB 12.7 (L) 07/21/2015   HCT 39.6 07/21/2015   MCV 86.7 07/21/2015   PLT 195 07/21/2015   Lab Results  Component Value Date   NA 139 07/20/2015   K 3.9 07/20/2015   CO2 28 07/20/2015   GLUCOSE 245 (H) 07/20/2015   BUN 7 07/20/2015   CREATININE 0.60 (L) 07/20/2015   BILITOT 0.8 07/18/2015   ALKPHOS 55 07/18/2015   AST 15 07/18/2015   ALT 21 07/18/2015   PROT 6.2 (L) 07/18/2015   ALBUMIN 2.7 (L) 07/18/2015   CALCIUM 8.5 (L) 07/20/2015   ANIONGAP 7 07/20/2015   No results found for: CHOL No results found for: HDL No results found for: LDLCALC No results found for: TRIG No results found for: CHOLHDL Lab Results  Component Value Date   HGBA1C 10.6 (H) 07/18/2015      Assessment & Plan:   Problem List Items Addressed This Visit      Cardiovascular and Mediastinum   Hypertension associated with diabetes ([place])    Well controlled.  No changes to medicines.  Continue to work on eating a healthy diet and exercise.  Labs drawn today.        Relevant Medications   metoprolol (TOPROL-XL) 200 MG 24 hr tablet   hydrochlorothiazide (HYDRODIURIL) 25 MG tablet   JARDIANCE 25 MG TABS tablet   TRULICITY 1.5 RU/0.4VW SOPN   atorvastatin (LIPITOR) 20 MG tablet   amLODipine-benazepril (LOTREL) 10-40 MG capsule  Other Relevant Orders   CBC with Differential/Platelet    Comprehensive metabolic panel     Endocrine   Type 2 diabetes mellitus (HCC) - Primary    Well controlled.  No changes to medicines.  Continue to work on eating a healthy diet and exercise.  Labs drawn today.        Relevant Medications   JARDIANCE 25 MG TABS tablet   TRULICITY 1.5 MG/0.5ML SOPN   atorvastatin (LIPITOR) 20 MG tablet   amLODipine-benazepril (LOTREL) 10-40 MG capsule   Other Relevant Orders   POCT UA - Microalbumin (Completed)   POCT urinalysis dipstick (Completed)   CBC with Differential/Platelet   Comprehensive metabolic panel   Lipid panel   Hemoglobin A1c     Other   Mixed hyperlipidemia    Well controlled.  No changes to medicines.  Continue to work on eating a healthy diet and exercise.  Labs drawn today.        Relevant Medications   metoprolol (TOPROL-XL) 200 MG 24 hr tablet   hydrochlorothiazide (HYDRODIURIL) 25 MG tablet   atorvastatin (LIPITOR) 20 MG tablet   amLODipine-benazepril (LOTREL) 10-40 MG capsule   Other Relevant Orders   Lipid panel      No orders of the defined types were placed in this encounter.   Follow-up: Return in about 3 months (around 02/26/2020) for chronic fasting follow up.    SARA R [place] Noori, PA-C

## 2019-11-28 ENCOUNTER — Other Ambulatory Visit: Payer: Self-pay | Admitting: Physician Assistant

## 2019-11-28 LAB — CBC WITH DIFFERENTIAL/PLATELET
Basophils Absolute: 0.1 10*3/uL (ref 0.0–0.2)
Basos: 1 %
EOS (ABSOLUTE): 0.1 10*3/uL (ref 0.0–0.4)
Eos: 1 %
Hematocrit: 52.2 % — ABNORMAL HIGH (ref 37.5–51.0)
Hemoglobin: 17.3 g/dL (ref 13.0–17.7)
Immature Grans (Abs): 0 10*3/uL (ref 0.0–0.1)
Immature Granulocytes: 0 %
Lymphocytes Absolute: 2.3 10*3/uL (ref 0.7–3.1)
Lymphs: 31 %
MCH: 27.9 pg (ref 26.6–33.0)
MCHC: 33.1 g/dL (ref 31.5–35.7)
MCV: 84 fL (ref 79–97)
Monocytes Absolute: 0.7 10*3/uL (ref 0.1–0.9)
Monocytes: 10 %
Neutrophils Absolute: 4.3 10*3/uL (ref 1.4–7.0)
Neutrophils: 57 %
Platelets: 186 10*3/uL (ref 150–450)
RBC: 6.19 x10E6/uL — ABNORMAL HIGH (ref 4.14–5.80)
RDW: 13.4 % (ref 11.6–15.4)
WBC: 7.5 10*3/uL (ref 3.4–10.8)

## 2019-11-28 LAB — COMPREHENSIVE METABOLIC PANEL
ALT: 21 IU/L (ref 0–44)
AST: 15 IU/L (ref 0–40)
Albumin/Globulin Ratio: 1.8 (ref 1.2–2.2)
Albumin: 4.5 g/dL (ref 4.0–5.0)
Alkaline Phosphatase: 78 IU/L (ref 39–117)
BUN/Creatinine Ratio: 22 — ABNORMAL HIGH (ref 9–20)
BUN: 19 mg/dL (ref 6–20)
Bilirubin Total: 0.4 mg/dL (ref 0.0–1.2)
CO2: 24 mmol/L (ref 20–29)
Calcium: 9.9 mg/dL (ref 8.7–10.2)
Chloride: 95 mmol/L — ABNORMAL LOW (ref 96–106)
Creatinine, Ser: 0.86 mg/dL (ref 0.76–1.27)
GFR calc Af Amer: 130 mL/min/{1.73_m2} (ref >59)
GFR calc non Af Amer: 112 mL/min/{1.73_m2} (ref >59)
Globulin, Total: 2.5 g/dL (ref 1.5–4.5)
Glucose: 197 mg/dL — ABNORMAL HIGH (ref 65–99)
Potassium: 4.4 mmol/L (ref 3.5–5.2)
Sodium: 138 mmol/L (ref 134–144)
Total Protein: 7 g/dL (ref 6.0–8.5)

## 2019-11-28 LAB — LIPID PANEL
Chol/HDL Ratio: 5.9 ratio — ABNORMAL HIGH (ref 0.0–5.0)
Cholesterol, Total: 229 mg/dL — ABNORMAL HIGH (ref 100–199)
HDL: 39 mg/dL — ABNORMAL LOW (ref >39)
LDL Chol Calc (NIH): 98 mg/dL (ref 0–99)
Triglycerides: 550 mg/dL — ABNORMAL HIGH (ref 0–149)
VLDL Cholesterol Cal: 92 mg/dL — ABNORMAL HIGH (ref 5–40)

## 2019-11-28 LAB — HEMOGLOBIN A1C
Est. average glucose Bld gHb Est-mCnc: 203 mg/dL
Hgb A1c MFr Bld: 8.7 % — ABNORMAL HIGH (ref 4.8–5.6)

## 2019-11-28 LAB — CARDIOVASCULAR RISK ASSESSMENT

## 2019-11-28 MED ORDER — ATORVASTATIN CALCIUM 40 MG PO TABS: 40.0000 mg | ORAL_TABLET | Freq: Every day | ORAL | 1 refills | Status: DC

## 2020-01-09 ENCOUNTER — Other Ambulatory Visit: Payer: Self-pay | Admitting: Physician Assistant

## 2020-01-15 ENCOUNTER — Other Ambulatory Visit: Payer: Self-pay

## 2020-01-15 MED ORDER — METOPROLOL SUCCINATE ER 200 MG PO TB24: 200.0000 mg | ORAL_TABLET | Freq: Every day | ORAL | 0 refills | Status: DC

## 2020-01-15 MED ORDER — PIOGLITAZONE HCL 30 MG PO TABS: 30.0000 mg | ORAL_TABLET | Freq: Every day | ORAL | 1 refills | Status: DC

## 2020-01-15 MED ORDER — AMLODIPINE BESY-BENAZEPRIL HCL 10-40 MG PO CAPS: 1.0000 | ORAL_CAPSULE | Freq: Every day | ORAL | 0 refills | Status: DC

## 2020-03-02 ENCOUNTER — Other Ambulatory Visit: Payer: Self-pay

## 2020-03-02 ENCOUNTER — Encounter: Payer: Self-pay | Admitting: Physician Assistant

## 2020-03-02 ENCOUNTER — Ambulatory Visit: Payer: BLUE CROSS/BLUE SHIELD | Admitting: Physician Assistant

## 2020-03-02 VITALS — BP 128/92 | HR 78 | Temp 97.0°F | Ht 74.0 in | Wt >= 6400 oz

## 2020-03-02 DIAGNOSIS — E782 Mixed hyperlipidemia: Secondary | ICD-10-CM

## 2020-03-02 DIAGNOSIS — E1165 Type 2 diabetes mellitus with hyperglycemia: Secondary | ICD-10-CM | POA: Diagnosis not present

## 2020-03-02 DIAGNOSIS — I10 Essential (primary) hypertension: Secondary | ICD-10-CM | POA: Diagnosis not present

## 2020-03-02 DIAGNOSIS — E1159 Type 2 diabetes mellitus with other circulatory complications: Secondary | ICD-10-CM | POA: Diagnosis not present

## 2020-03-02 DIAGNOSIS — I152 Hypertension secondary to endocrine disorders: Secondary | ICD-10-CM

## 2020-03-02 NOTE — Assessment & Plan Note (Addendum)
Will restart hctz 25mg  qd Continue to work on eating a healthy diet and exercise.  Labs drawn today.

## 2020-03-02 NOTE — Assessment & Plan Note (Signed)
Well controlled.  ?No changes to medicines.  ?Continue to work on eating a healthy diet and exercise.  ?Labs drawn today.  ?

## 2020-03-02 NOTE — Progress Notes (Signed)
Established Patient Office Visit  Subjective:  Patient ID: William Massey, male    DOB: 06/21/1986  Age: 34 y.o. MRN: [id number]  CC:  Chief Complaint  Patient presents with  . Diabetes    3 months fasting    HPI [name] presents for follow up diabetes     Patient presents with type 2 diabetes mellitus with other specified complication.  Specifically, this is type 2, non-insulin requiring diabetes, complicated by hypertension, obesity, and hyperlipidemia.  Date of diagnosis 2014.  Compliance with treatment has been fair; he skips some medication doses due to side effects.  He specifically denies associated symptoms, including blurred vision, fatigue, headache, hypoglycemia, nocturia, peripheral neuropathy, excessive thirst and frequent urination.  Depression screen is performed and is negative.   Tobacco screen: Non-smoker.  Current meds include an oral hypoglycemic ( Jardiance 25 mg QD ),actos  qd  insulin/injectable ( Trulicity 1.5 mg injection once a week ),  Lotensin, and lipid lowering agent ( Lipitor ).  He reports home blood glucose readings have been up and down but not checking consistently --- did state he started back at gym yesterday Concurrent health problems include hypertension and hypercholesterolemia.      Pt presents for follow up of hypertension.  Date of diagnosis 2014.  His current cardiac medication regimen includes , a beta-blocker ( Metoprolol ER 200 mg QD ), and lotrel 10/40 He has not kept a blood pressure diary, but states that typical readings show systolics in the 140-160 range and diastolics in the 90-100 range. He is supposed to have been on hctz but states he has not been taking that He is tolerating the medication well without side effects.  Compliance with treatment has been fair; he does not follow a diet and exercise regimen .  Concurrent health problems include hyperlipidemia and diabetes.   Pt presents with hyperlipidemia.  Date of diagnosis  01/24/2019.  Current treatment includes Lipitor.  Compliance with treatment has been good; he takes his medication as directed, maintains his low cholesterol diet, and follows up as directed.  He denies experiencing any hypercholesterolemia related symptoms.  Concurrent health problems include hypertension and diabetes.       Past Medical History:  Diagnosis Date  . Asthma   . Diabetes mellitus without complication (HCC)   . Generalized anxiety disorder   . Hypertension   . Mild intermittent asthma, uncomplicated   . Mixed hyperlipidemia   . Morbid (severe) obesity due to excess calories (HCC)   . Personal history of malignant neoplasm of testis     Past Surgical History:  Procedure Laterality Date  . ORCHIECTOMY Left 02/19/2015   Procedure: RADICAL ORCHIECTOMY;  Surgeon: Malen Gauze, MD;  Location: [facility];  Service: Urology;  Laterality: Left;    Family History  Problem Relation Age of Onset  . Hypertension Father   . Diabetes type II Father   . Leukemia Brother     Social History   Socioeconomic History  . Marital status: Single    Spouse name: Not on file  . Number of children: 1  . Years of education: Not on file  . Highest education level: Not on file  Occupational History  . Not on file  Tobacco Use  . Smoking status: Never Smoker  . Smokeless tobacco: Never Used  Vaping Use  . Vaping Use: Never used  Substance and Sexual Activity  . Alcohol use: Not on file    Comment: Drinks alcohol very infrequently. He  typically consumes beer  . Drug use: Yes    Frequency: 0.2 times per week    Types: Marijuana  . Sexual activity: Not on file  Other Topics Concern  . Not on file  Social History Narrative  . Not on file   Social Determinants of Health   Financial Resource Strain:   . Difficulty of Paying Living Expenses:   Food Insecurity:   . Worried About Programme researcher, broadcasting/film/video in the Last Year:   . Barista in the Last Year:   Transportation Needs:    . Freight forwarder (Medical):   Marland Kitchen Lack of Transportation (Non-Medical):   Physical Activity:   . Days of Exercise per Week:   . Minutes of Exercise per Session:   Stress:   . Feeling of Stress :   Social Connections:   . Frequency of Communication with Friends and Family:   . Frequency of Social Gatherings with Friends and Family:   . Attends Religious Services:   . Active Member of Clubs or Organizations:   . Attends Banker Meetings:   Marland Kitchen Marital Status:   Intimate Partner Violence:   . Fear of Current or Ex-Partner:   . Emotionally Abused:   Marland Kitchen Physically Abused:   . Sexually Abused:      Current Outpatient Medications:  .  albuterol (VENTOLIN HFA) 108 (90 Base) MCG/ACT inhaler, Inhale 1-2 puffs into the lungs every 6 (six) hours as needed for wheezing or shortness of breath., Disp: , Rfl:  .  amLODipine-benazepril (LOTREL) 10-40 MG capsule, Take 1 capsule by mouth daily., Disp: 90 capsule, Rfl: 0 .  atorvastatin (LIPITOR) 40 MG tablet, Take 1 tablet (40 mg total) by mouth daily., Disp: 90 tablet, Rfl: 1 .  JARDIANCE 25 MG TABS tablet, Take 25 mg by mouth every morning., Disp: 90 tablet, Rfl: 1 .  metoprolol (TOPROL-XL) 200 MG 24 hr tablet, Take 1 tablet (200 mg total) by mouth daily., Disp: 90 tablet, Rfl: 0 .  pioglitazone (ACTOS) 30 MG tablet, Take 1 tablet (30 mg total) by mouth daily., Disp: 30 tablet, Rfl: 1 .  TRULICITY 1.5 MG/0.5ML SOPN, Inject 1.5 mg as directed once a week., Disp: , Rfl:  .  hydrochlorothiazide (HYDRODIURIL) 25 MG tablet, TAKE 1 TABLET BY MOUTH DAILY (Patient not taking: Reported on 03/02/2020), Disp: 100 tablet, Rfl: 0   Allergies  Allergen Reactions  . Metformin And Related Nausea Only    ROS CONSTITUTIONAL: Negative for chills, fatigue, fever, unintentional weight gain and unintentional weight loss.  E/N/T: Negative for ear pain, nasal congestion and sore throat.  CARDIOVASCULAR: Negative for chest pain, dizziness,  palpitations and pedal edema.  RESPIRATORY: Negative for recent cough and dyspnea.  GASTROINTESTINAL: Negative for abdominal pain, acid reflux symptoms, constipation, diarrhea, nausea and vomiting.  MSK: Negative for arthralgias and myalgias.  INTEGUMENTARY: Negative for rash.  NEUROLOGICAL: Negative for dizziness and headaches.  PSYCHIATRIC: Negative for sleep disturbance and to question depression screen.  Negative for depression, negative for anhedonia.        Objective:    PHYSICAL EXAM:   VS: BP (!) 128/92 (BP Location: Left Arm, Patient Position: Sitting)   Pulse 78   Temp (!) 97 F (36.1 C) (Temporal)   Ht 6\' 2"  (1.88 m)   Wt (!) 505 lb (229.1 kg)   SpO2 96%   BMI 64.84 kg/m   GEN: Well nourished, well developed, in no acute distress morbidly obese  Cardiac: RRR; no  murmurs, rubs, or gallops,no edema - varicosities noted left leg Respiratory:  normal respiratory rate and pattern with no distress - normal breath sounds with no rales, rhonchi, wheezes or rubs  MS: no deformity or atrophy  Skin: warm and dry, no rash  Neuro:  Alert and Oriented x 3, Strength and sensation are intact - CN II-Xii grossly intact Psych: euthymic mood, appropriate affect and demeanor  Diabetic Foot Exam - Simple   Simple Foot Form Diabetic Foot exam was performed with the following findings: Yes 03/02/2020  8:53 AM  Visual Inspection No deformities, no ulcerations, no other skin breakdown bilaterally: Yes Sensation Testing Intact to touch and monofilament testing bilaterally: Yes Pulse Check Posterior Tibialis and Dorsalis pulse intact bilaterally: Yes Comments     No visits with results within 1 Day(s) from this visit.  Latest known visit with results is:  Office Visit on 11/27/2019  Component Date Value Ref Range Status  . Microalbumin Ur, POC 11/27/2019 negative  mg/L Final  . Glucose, UA 11/27/2019 Positive* Negative Final  . Bilirubin, UA 11/27/2019 negative   Final  .  Ketones, UA 11/27/2019 1+   Final  . Spec Grav, UA 11/27/2019 1.020  1.010 - 1.025 Final  . Blood, UA 11/27/2019 negative   Final  . pH, UA 11/27/2019 5.5  5.0 - 8.0 Final  . Protein, UA 11/27/2019 Negative  Negative Final  . Urobilinogen, UA 11/27/2019 negative* 0.2 or 1.0 E.U./dL Final  . Nitrite, UA 16/05/9603 negative   Final  . Leukocytes, UA 11/27/2019 Negative  Negative Final  . WBC 11/27/2019 7.5  3.4 - 10.8 x10E3/uL Final  . RBC 11/27/2019 6.19* 4.14 - 5.80 x10E6/uL Final  . Hemoglobin 11/27/2019 17.3  13.0 - 17.7 g/dL Final  . Hematocrit 54/04/8118 52.2* 37.5 - 51.0 % Final  . MCV 11/27/2019 84  79 - 97 fL Final  . MCH 11/27/2019 27.9  26.6 - 33.0 pg Final  . MCHC 11/27/2019 33.1  31 - 35 g/dL Final  . RDW 14/78/2956 13.4  11.6 - 15.4 % Final  . Platelets 11/27/2019 186  150 - 450 x10E3/uL Final  . Neutrophils 11/27/2019 57  Not Estab. % Final  . Lymphs 11/27/2019 31  Not Estab. % Final  . Monocytes 11/27/2019 10  Not Estab. % Final  . Eos 11/27/2019 1  Not Estab. % Final  . Basos 11/27/2019 1  Not Estab. % Final  . Neutrophils Absolute 11/27/2019 4.3  1 - 7 x10E3/uL Final  . Lymphocytes Absolute 11/27/2019 2.3  0 - 3 x10E3/uL Final  . Monocytes Absolute 11/27/2019 0.7  0 - 0 x10E3/uL Final  . EOS (ABSOLUTE) 11/27/2019 0.1  0.0 - 0.4 x10E3/uL Final  . Basophils Absolute 11/27/2019 0.1  0 - 0 x10E3/uL Final  . Immature Granulocytes 11/27/2019 0  Not Estab. % Final  . Immature Grans (Abs) 11/27/2019 0.0  0.0 - 0.1 x10E3/uL Final  . Glucose 11/27/2019 197* 65 - 99 mg/dL Final  . BUN 21/30/8657 19  6 - 20 mg/dL Final  . Creatinine, Ser 11/27/2019 0.86  0.76 - 1.27 mg/dL Final  . GFR calc non Af Amer 11/27/2019 112  >59 mL/min/1.73 Final  . GFR calc Af Amer 11/27/2019 130  >59 mL/min/1.73 Final  . BUN/Creatinine Ratio 11/27/2019 22* 9 - 20 Final  . Sodium 11/27/2019 138  134 - 144 mmol/L Final  . Potassium 11/27/2019 4.4  3.5 - 5.2 mmol/L Final  . Chloride 11/27/2019 95* 96  - 106 mmol/L Final  .  CO2 11/27/2019 24  20 - 29 mmol/L Final  . Calcium 11/27/2019 9.9  8.7 - 10.2 mg/dL Final  . Total Protein 11/27/2019 7.0  6.0 - 8.5 g/dL Final  . Albumin 40/98/119104/03/2020 4.5  4.0 - 5.0 g/dL Final  . Globulin, Total 11/27/2019 2.5  1.5 - 4.5 g/dL Final  . Albumin/Globulin Ratio 11/27/2019 1.8  1.2 - 2.2 Final  . Bilirubin Total 11/27/2019 0.4  0.0 - 1.2 mg/dL Final  . Alkaline Phosphatase 11/27/2019 78  39 - 117 IU/L Final  . AST 11/27/2019 15  0 - 40 IU/L Final  . ALT 11/27/2019 21  0 - 44 IU/L Final  . Cholesterol, Total 11/27/2019 229* 100 - 199 mg/dL Final  . Triglycerides 11/27/2019 550* 0 - 149 mg/dL Final  . HDL 47/82/956204/03/2020 39* >39 mg/dL Final  . VLDL Cholesterol Cal 11/27/2019 92* 5 - 40 mg/dL Final  . LDL Chol Calc (NIH) 11/27/2019 98  0 - 99 mg/dL Final  . Chol/HDL Ratio 11/27/2019 5.9* 0.0 - 5.0 ratio Final   Comment:                                   T. Chol/HDL Ratio                                             Men  Women                               1/2 Avg.Risk  3.4    3.3                                   Avg.Risk  5.0    4.4                                2X Avg.Risk  9.6    7.1                                3X Avg.Risk 23.4   11.0   . Hgb A1c MFr Bld 11/27/2019 8.7* 4.8 - 5.6 % Final   Comment:          Prediabetes: 5.7 - 6.4          Diabetes: >6.4          Glycemic control for adults with diabetes: <7.0   . Est. average glucose Bld gHb Est-m* 11/27/2019 203  mg/dL Final  . Interpretation 11/27/2019 Note   Final   Supplemental report is available.    BP (!) 128/92 (BP Location: Left Arm, Patient Position: Sitting)   Pulse 78   Temp (!) 97 F (36.1 C) (Temporal)   Ht 6\' 2"  (1.88 m)   Wt (!) 505 lb (229.1 kg)   SpO2 96%   BMI 64.84 kg/m  Wt Readings from Last 3 Encounters:  03/02/20 (!) 505 lb (229.1 kg)  11/27/19 (!) 503 lb (228.2 kg)  07/17/15 (!) 548 lb 4 oz (248.7 kg)     Health Maintenance Due  Topic Date Due  . Hepatitis C  Screening  Never done  . OPHTHALMOLOGY EXAM  Never done    There are no preventive care reminders to display for this patient.  No results found for: TSH Lab Results  Component Value Date   WBC 7.5 11/27/2019   HGB 17.3 11/27/2019   HCT 52.2 (H) 11/27/2019   MCV 84 11/27/2019   PLT 186 11/27/2019   Lab Results  Component Value Date   NA 138 11/27/2019   K 4.4 11/27/2019   CO2 24 11/27/2019   GLUCOSE 197 (H) 11/27/2019   BUN 19 11/27/2019   CREATININE 0.86 11/27/2019   BILITOT 0.4 11/27/2019   ALKPHOS 78 11/27/2019   AST 15 11/27/2019   ALT 21 11/27/2019   PROT 7.0 11/27/2019   ALBUMIN 4.5 11/27/2019   CALCIUM 9.9 11/27/2019   ANIONGAP 7 07/20/2015   Lab Results  Component Value Date   CHOL 229 (H) 11/27/2019   Lab Results  Component Value Date   HDL 39 (L) 11/27/2019   Lab Results  Component Value Date   LDLCALC 98 11/27/2019   Lab Results  Component Value Date   TRIG 550 (H) 11/27/2019   Lab Results  Component Value Date   CHOLHDL 5.9 (H) 11/27/2019   Lab Results  Component Value Date   HGBA1C 8.7 (H) 11/27/2019      Assessment & Plan:   Problem List Items Addressed This Visit      Cardiovascular and Mediastinum   Hypertension associated with diabetes (HCC)    Will restart hctz 25mg  qd Continue to work on eating a healthy diet and exercise.  Labs drawn today.        Relevant Orders   CBC with Differential/Platelet   Comprehensive metabolic panel     Endocrine   Type 2 diabetes mellitus (HCC) - Primary    Well controlled.  No changes to medicines.  Continue to work on eating a healthy diet and exercise.  Labs drawn today.        Relevant Orders   Hemoglobin A1c     Other   Mixed hyperlipidemia    Well controlled.  No changes to medicines.  Continue to work on eating a healthy diet and exercise.  Labs drawn today.        Relevant Orders   Lipid panel      No orders of the defined types were placed in this  encounter.   Follow-up: No follow-ups on file.    SARA R Ronit Marczak, PA-C

## 2020-03-03 ENCOUNTER — Other Ambulatory Visit: Payer: Self-pay | Admitting: Physician Assistant

## 2020-03-03 LAB — LIPID PANEL
Chol/HDL Ratio: 4.4 ratio (ref 0.0–5.0)
Cholesterol, Total: 190 mg/dL (ref 100–199)
HDL: 43 mg/dL (ref >39)
LDL Chol Calc (NIH): 99 mg/dL (ref 0–99)
Triglycerides: 283 mg/dL — ABNORMAL HIGH (ref 0–149)
VLDL Cholesterol Cal: 48 mg/dL — ABNORMAL HIGH (ref 5–40)

## 2020-03-03 LAB — COMPREHENSIVE METABOLIC PANEL
ALT: 24 IU/L (ref 0–44)
AST: 16 IU/L (ref 0–40)
Albumin/Globulin Ratio: 2 (ref 1.2–2.2)
Albumin: 4.8 g/dL (ref 4.0–5.0)
Alkaline Phosphatase: 75 IU/L (ref 48–121)
BUN/Creatinine Ratio: 19 (ref 9–20)
BUN: 16 mg/dL (ref 6–20)
Bilirubin Total: 0.5 mg/dL (ref 0.0–1.2)
CO2: 26 mmol/L (ref 20–29)
Calcium: 9.6 mg/dL (ref 8.7–10.2)
Chloride: 97 mmol/L (ref 96–106)
Creatinine, Ser: 0.86 mg/dL (ref 0.76–1.27)
GFR calc Af Amer: 129 mL/min/{1.73_m2} (ref >59)
GFR calc non Af Amer: 112 mL/min/{1.73_m2} (ref >59)
Globulin, Total: 2.4 g/dL (ref 1.5–4.5)
Glucose: 171 mg/dL — ABNORMAL HIGH (ref 65–99)
Potassium: 4 mmol/L (ref 3.5–5.2)
Sodium: 138 mmol/L (ref 134–144)
Total Protein: 7.2 g/dL (ref 6.0–8.5)

## 2020-03-03 LAB — HEMOGLOBIN A1C
Est. average glucose Bld gHb Est-mCnc: 209 mg/dL
Hgb A1c MFr Bld: 8.9 % — ABNORMAL HIGH (ref 4.8–5.6)

## 2020-03-03 LAB — CBC WITH DIFFERENTIAL/PLATELET
Basophils Absolute: 0.1 10*3/uL (ref 0.0–0.2)
Basos: 1 %
EOS (ABSOLUTE): 0.1 10*3/uL (ref 0.0–0.4)
Eos: 1 %
Hematocrit: 50.4 % (ref 37.5–51.0)
Hemoglobin: 17 g/dL (ref 13.0–17.7)
Immature Grans (Abs): 0 10*3/uL (ref 0.0–0.1)
Immature Granulocytes: 0 %
Lymphocytes Absolute: 2.7 10*3/uL (ref 0.7–3.1)
Lymphs: 31 %
MCH: 28.7 pg (ref 26.6–33.0)
MCHC: 33.7 g/dL (ref 31.5–35.7)
MCV: 85 fL (ref 79–97)
Monocytes Absolute: 0.9 10*3/uL (ref 0.1–0.9)
Monocytes: 10 %
Neutrophils Absolute: 4.8 10*3/uL (ref 1.4–7.0)
Neutrophils: 57 %
Platelets: 193 10*3/uL (ref 150–450)
RBC: 5.93 x10E6/uL — ABNORMAL HIGH (ref 4.14–5.80)
RDW: 15.3 % (ref 11.6–15.4)
WBC: 8.5 10*3/uL (ref 3.4–10.8)

## 2020-03-03 LAB — CARDIOVASCULAR RISK ASSESSMENT

## 2020-03-03 MED ORDER — PIOGLITAZONE HCL 45 MG PO TABS
45.0000 mg | ORAL_TABLET | Freq: Every day | ORAL | 1 refills | Status: DC
Start: 2020-03-03 — End: 2020-06-09

## 2020-04-01 ENCOUNTER — Encounter: Payer: Self-pay | Admitting: Legal Medicine

## 2020-04-01 ENCOUNTER — Telehealth (INDEPENDENT_AMBULATORY_CARE_PROVIDER_SITE_OTHER): Payer: BLUE CROSS/BLUE SHIELD | Admitting: Legal Medicine

## 2020-04-01 VITALS — Temp 98.7°F | Ht 74.0 in | Wt >= 6400 oz

## 2020-04-01 DIAGNOSIS — Z20822 Contact with and (suspected) exposure to covid-19: Secondary | ICD-10-CM

## 2020-04-01 NOTE — Progress Notes (Signed)
Virtual Visit via Telephone Note   This visit type was conducted due to national recommendations for restrictions regarding the COVID-19 Pandemic (e.g. social distancing) in an effort to limit this patient's exposure and mitigate transmission in our community.  Due to his co-morbid illnesses, this patient is at least at moderate risk for complications without adequate follow up.  This format is felt to be most appropriate for this patient at this time.  The patient did not have access to video technology/had technical difficulties with video requiring transitioning to audio format only (telephone).  All issues noted in this document were discussed and addressed.  No physical exam could be performed with this format.  Patient verbally consented to a telehealth visit.   Date:  04/01/2020   ID:  William Massey, DOB [birth year], MRN [id number]  Patient Location: Home Provider Location: Office/Clinic  PCP:  Marianne Sofia, PA-C   Evaluation Performed:  New Patient Evaluation  Chief Complaint:  Daughter covid positive yesterday  History of Present Illness:    William Massey is a 34 y.o. male with headache, congestion, exposed  t0 covid.  Nausea and cough  The patient does have symptoms concerning for COVID-19 infection (fever, chills, cough, or new shortness of breath).    Past Medical History:  Diagnosis Date   Asthma    Diabetes mellitus without complication (HCC)    Generalized anxiety disorder    Hypertension    Mild intermittent asthma, uncomplicated    Mixed hyperlipidemia    Morbid (severe) obesity due to excess calories (HCC)    Personal history of malignant neoplasm of testis     Past Surgical History:  Procedure Laterality Date   ORCHIECTOMY Left 02/19/2015   Procedure: RADICAL ORCHIECTOMY;  Surgeon: Malen Gauze, MD;  Location: [facility];  Service: Urology;  Laterality: Left;    Family History  Problem Relation Age of Onset   Hypertension Father     Diabetes type II Father    Leukemia Brother     Social History   Socioeconomic History   Marital status: Single    Spouse name: Not on file   Number of children: 1   Years of education: Not on file   Highest education level: Not on file  Occupational History   Not on file  Tobacco Use   Smoking status: Never Smoker   Smokeless tobacco: Never Used  Vaping Use   Vaping Use: Never used  Substance and Sexual Activity   Alcohol use: Not on file    Comment: Drinks alcohol very infrequently. He typically consumes beer   Drug use: Yes    Frequency: 0.2 times per week    Types: Marijuana   Sexual activity: Not on file  Other Topics Concern   Not on file  Social History Narrative   Not on file   Social Determinants of Health   Financial Resource Strain:    Difficulty of Paying Living Expenses:   Food Insecurity:    Worried About Programme researcher, broadcasting/film/video in the Last Year:    Barista in the Last Year:   Transportation Needs:    Freight forwarder (Medical):    Lack of Transportation (Non-Medical):   Physical Activity:    Days of Exercise per Week:    Minutes of Exercise per Session:   Stress:    Feeling of Stress :   Social Connections:    Frequency of Communication with Friends and Family:    Frequency of Social  Gatherings with Friends and Family:    Attends Religious Services:    Active Member of Clubs or Organizations:    Attends Engineer, structural:    Marital Status:   Intimate Partner Violence:    Fear of Current or Ex-Partner:    Emotionally Abused:    Physically Abused:    Sexually Abused:     Outpatient Medications Prior to Visit  Medication Sig Dispense Refill   albuterol (VENTOLIN HFA) 108 (90 Base) MCG/ACT inhaler Inhale 1-2 puffs into the lungs every 6 (six) hours as needed for wheezing or shortness of breath.     amLODipine-benazepril (LOTREL) 10-40 MG capsule Take 1 capsule by mouth daily. 90 capsule  0   atorvastatin (LIPITOR) 40 MG tablet Take 1 tablet (40 mg total) by mouth daily. 90 tablet 1   hydrochlorothiazide (HYDRODIURIL) 25 MG tablet TAKE 1 TABLET BY MOUTH DAILY 100 tablet 0   JARDIANCE 25 MG TABS tablet Take 25 mg by mouth every morning. 90 tablet 1   metoprolol (TOPROL-XL) 200 MG 24 hr tablet Take 1 tablet (200 mg total) by mouth daily. 90 tablet 0   pioglitazone (ACTOS) 45 MG tablet Take 1 tablet (45 mg total) by mouth daily. 90 tablet 1   TRULICITY 1.5 MG/0.5ML SOPN Inject 1.5 mg as directed once a week.     No facility-administered medications prior to visit.   .med Allergies:   Metformin and related   Social History   Tobacco Use   Smoking status: Never Smoker   Smokeless tobacco: Never Used  Vaping Use   Vaping Use: Never used  Substance Use Topics   Alcohol use: Not on file    Comment: Drinks alcohol very infrequently. He typically consumes beer   Drug use: Yes    Frequency: 0.2 times per week    Types: Marijuana     Review of Systems  Constitutional: Positive for chills and fever.  HENT: Positive for congestion, sinus pain and sore throat.   Eyes: Negative.   Respiratory: Positive for cough.   Cardiovascular: Negative.   Gastrointestinal: Negative.   Genitourinary: Negative.   Musculoskeletal: Negative.   Skin: Negative.      Labs/Other Tests and Data Reviewed:    Recent Labs: 03/02/2020: ALT 24; BUN 16; Creatinine, Ser 0.86; Hemoglobin 17.0; Platelets 193; Potassium 4.0; Sodium 138   Recent Lipid Panel Lab Results  Component Value Date/Time   CHOL 190 03/02/2020 08:56 AM   TRIG 283 (H) 03/02/2020 08:56 AM   HDL 43 03/02/2020 08:56 AM   CHOLHDL 4.4 03/02/2020 08:56 AM   LDLCALC 99 03/02/2020 08:56 AM    Wt Readings from Last 3 Encounters:  04/01/20 (!) 520 lb (235.9 kg)  03/02/20 (!) 505 lb (229.1 kg)  11/27/19 (!) 503 lb (228.2 kg)     Objective:    Vital Signs:  Temp 98.7 F (37.1 C)    Ht 6\' 2"  (1.88 m)    Wt (!) 520  lb (235.9 kg)    BMI 66.76 kg/m    Physical Exam VS reviewed  ASSESSMENT & PLAN:   Diagnoses and all orders for this visit: Close exposure to COVID-19 virus -     Novel Coronavirus, NAA ([facility])  Patient needs Covid test and he needs to quarentine      COVID-19 Education: The signs and symptoms of COVID-19 were discussed with the patient and how to seek care for testing (follow up with PCP or arrange E-visit). The importance of social distancing  was discussed today.  Time:   Today, I have spent 20 minutes with the patient with telehealth technology discussing the above problems.    Follow Up:  In Person prn  Signed, Brent Bulla, MD  04/01/2020 9:43 AM    [facility]

## 2020-04-03 LAB — SARS-COV-2, NAA 2 DAY TAT

## 2020-04-03 LAB — NOVEL CORONAVIRUS, NAA: SARS-CoV-2, NAA: DETECTED — AB

## 2020-04-04 ENCOUNTER — Telehealth (HOSPITAL_COMMUNITY): Payer: Self-pay | Admitting: Nurse Practitioner

## 2020-04-04 DIAGNOSIS — U071 COVID-19: Secondary | ICD-10-CM | POA: Insufficient documentation

## 2020-04-04 NOTE — Progress Notes (Signed)
Positive Covid lp

## 2020-04-04 NOTE — Telephone Encounter (Signed)
Called to discuss with Fredrik Cove about Covid symptoms and the use of regeneron, a monoclonal antibody infusion for those with mild to moderate Covid symptoms and at a high risk of hospitalization.     Pt is qualified for this infusion at the [facility] due to co-morbid conditions and/or a member of an at-risk group, however declines infusion at this time. Symptoms tier reviewed as well as criteria for ending isolation.  Symptoms reviewed that would warrant ED/Hospital evaluation. Preventative practices reviewed. Patient verbalized understanding. Patient advised to call back if he/she opts to proceed with infusion. Callback number provided. Urgent care and/or ER precautions given for severe symptoms. Last date eligible for infusion:  8/21     Patient Active Problem List   Diagnosis Date Noted  . Close exposure to COVID-19 virus 04/01/2020  . Mixed hyperlipidemia 11/27/2019  . Cellulitis and abscess   . Scrotal pain   . Scrotal edema 07/17/2015  . Type 2 diabetes mellitus (HCC) 07/17/2015  . Hypertension associated with diabetes (HCC) 07/17/2015  . Sepsis (HCC)   . Obesity 10/13/2012  . Ureteral stone 10/13/2012  . Abdominal pain 10/12/2012     Consuello Masse, NP [facility]  [phone] [email or web address]

## 2020-04-08 ENCOUNTER — Other Ambulatory Visit: Payer: Self-pay | Admitting: Legal Medicine

## 2020-04-08 ENCOUNTER — Telehealth: Payer: Self-pay

## 2020-04-08 NOTE — Telephone Encounter (Signed)
Patient called stating he is having nausea every time he eats solids, he can drink and have soup or applesauce fine but when he eats solids he has nausea. Please advise on next steps

## 2020-04-08 NOTE — Telephone Encounter (Signed)
Should see °lp °

## 2020-06-09 ENCOUNTER — Other Ambulatory Visit: Payer: Self-pay | Admitting: Nurse Practitioner

## 2020-06-09 ENCOUNTER — Other Ambulatory Visit: Payer: Self-pay

## 2020-06-09 ENCOUNTER — Encounter: Payer: Self-pay | Admitting: Nurse Practitioner

## 2020-06-09 ENCOUNTER — Ambulatory Visit: Payer: BLUE CROSS/BLUE SHIELD | Admitting: Physician Assistant

## 2020-06-09 ENCOUNTER — Ambulatory Visit: Payer: BLUE CROSS/BLUE SHIELD | Admitting: Nurse Practitioner

## 2020-06-09 VITALS — BP 162/102 | HR 94 | Temp 97.9°F | Ht 74.0 in | Wt >= 6400 oz

## 2020-06-09 DIAGNOSIS — I152 Hypertension secondary to endocrine disorders: Secondary | ICD-10-CM

## 2020-06-09 DIAGNOSIS — E1159 Type 2 diabetes mellitus with other circulatory complications: Secondary | ICD-10-CM

## 2020-06-09 DIAGNOSIS — E782 Mixed hyperlipidemia: Secondary | ICD-10-CM

## 2020-06-09 DIAGNOSIS — Z9079 Acquired absence of other genital organ(s): Secondary | ICD-10-CM | POA: Diagnosis not present

## 2020-06-09 DIAGNOSIS — E1169 Type 2 diabetes mellitus with other specified complication: Secondary | ICD-10-CM | POA: Diagnosis not present

## 2020-06-09 DIAGNOSIS — Z6841 Body Mass Index (BMI) 40.0 and over, adult: Secondary | ICD-10-CM

## 2020-06-09 DIAGNOSIS — R7989 Other specified abnormal findings of blood chemistry: Secondary | ICD-10-CM

## 2020-06-09 MED ORDER — PIOGLITAZONE HCL 15 MG PO TABS: 15.0000 mg | ORAL_TABLET | Freq: Every day | ORAL | 1 refills | Status: DC

## 2020-06-09 MED ORDER — VALSARTAN 160 MG PO TABS: 160.0000 mg | ORAL_TABLET | Freq: Every day | ORAL | 1 refills | Status: DC

## 2020-06-09 MED ORDER — TRULICITY 3 MG/0.5ML ~~LOC~~ SOAJ: 3.0000 mg | SUBCUTANEOUS | 1 refills | Status: DC

## 2020-06-09 MED ORDER — AMLODIPINE BESYLATE 10 MG PO TABS: 10.0000 mg | ORAL_TABLET | Freq: Every day | ORAL | 0 refills | Status: DC

## 2020-06-09 MED ORDER — FREESTYLE LIBRE 2 SENSOR MISC: 0 refills | Status: DC

## 2020-06-09 MED ORDER — METOPROLOL SUCCINATE ER 200 MG PO TB24: 200.0000 mg | ORAL_TABLET | Freq: Every day | ORAL | 0 refills | Status: DC

## 2020-06-09 NOTE — Progress Notes (Signed)
Subjective:  Patient ID: William Massey, male    DOB: 10-27-1985  Age: 34 y.o. MRN: [id number]  Chief Complaint  Patient presents with  . Diabetes    3 months fasting follow up    Diabetes He presents for his follow-up diabetic visit. He has type 2 diabetes mellitus. The initial diagnosis of diabetes was made 7 years ago. His disease course has been worsening. There are no hypoglycemic associated symptoms. Pertinent negatives for hypoglycemia include no dizziness, headaches or nervousness/anxiousness. There are no diabetic associated symptoms. Pertinent negatives for diabetes include no chest pain, no fatigue and no weakness. There are no hypoglycemic complications. Symptoms are worsening. Risk factors for coronary artery disease include hypertension, dyslipidemia, male sex, obesity, sedentary lifestyle and diabetes mellitus. Current diabetic treatment includes oral agent (dual therapy) (Trulicity-GLP1 injection once weekly; Actos & Jardiance daily). He is compliant with treatment most of the time. His weight is stable. He is following a generally unhealthy diet. When asked about meal planning, he reported none. He has not had a previous visit with a dietitian. He rarely participates in exercise. Home blood sugar record trend: Unknown. (Does not check blood sugars due to glucose monitor inoperable) An ACE inhibitor/angiotensin II receptor blocker is being taken. He does not see a podiatrist.Eye exam is not current.  Hypertension Pertinent negatives include no chest pain, headaches, palpitations or shortness of breath.     Hypertension, follow-up  BP Readings from Last 3 Encounters:  06/09/20 (!) 162/102  03/02/20 (!) 128/92  11/27/19 (!) 142/84   Wt Readings from Last 3 Encounters:  06/09/20 (!) 500 lb (226.8 kg)  04/01/20 (!) 520 lb (235.9 kg)  03/02/20 (!) 505 lb (229.1 kg)     He was last seen for hypertension 3 months ago.  BP at that visit was 128/92. Management since that visit  includes Amlodipine/Benazepril and HCTZ 25 mg.  He reports good compliance with treatment. He is not having side effects.  He is following a Regular diet. He is not exercising. He does not smoke.  Use of agents associated with hypertension: none.   Outside blood pressures are . Symptoms: No chest pain No chest pressure  No palpitations No syncope  No dyspnea No orthopnea  No paroxysmal nocturnal dyspnea No lower extremity edema   Pertinent labs: Lab Results  Component Value Date   CHOL 210 (H) 06/09/2020   HDL 51 06/09/2020   LDLCALC 119 (H) 06/09/2020   TRIG 230 (H) 06/09/2020   CHOLHDL 4.1 06/09/2020   Lab Results  Component Value Date   NA 140 06/09/2020   K 4.7 06/09/2020   CREATININE 0.95 06/09/2020   GFRNONAA 103 06/09/2020   GFRAA 119 06/09/2020   GLUCOSE 163 (H) 06/09/2020     The ASCVD Risk score (Goff DC Jr., et al., 2013) failed to calculate for the following reasons:   The 2013 ASCVD risk score is only valid for ages 79 to 80   ---------------------------------------------------------------------------------------------------    Current Outpatient Medications on File Prior to Visit  Medication Sig Dispense Refill  . albuterol (VENTOLIN HFA) 108 (90 Base) MCG/ACT inhaler Inhale 1-2 puffs into the lungs every 6 (six) hours as needed for wheezing or shortness of breath.    Marland Kitchen amLODipine-benazepril (LOTREL) 10-40 MG capsule Take 1 capsule by mouth daily. 90 capsule 0  . atorvastatin (LIPITOR) 40 MG tablet Take 1 tablet (40 mg total) by mouth daily. 90 tablet 1  . hydrochlorothiazide (HYDRODIURIL) 25 MG tablet TAKE 1 TABLET BY  MOUTH DAILY 100 tablet 0  . JARDIANCE 25 MG TABS tablet Take 25 mg by mouth every morning. 90 tablet 1  . metoprolol (TOPROL-XL) 200 MG 24 hr tablet Take 1 tablet (200 mg total) by mouth daily. 90 tablet 0  . pioglitazone (ACTOS) 45 MG tablet Take 1 tablet (45 mg total) by mouth daily. 90 tablet 1  . TRULICITY 1.5 MG/0.5ML SOPN Inject 1.5  mg as directed once a week.     No current facility-administered medications on file prior to visit.   Past Medical History:  Diagnosis Date  . Asthma   . Diabetes mellitus without complication (HCC)   . Generalized anxiety disorder   . Hypertension   . Mild intermittent asthma, uncomplicated   . Mixed hyperlipidemia   . Morbid (severe) obesity due to excess calories (HCC)   . Personal history of malignant neoplasm of testis    Past Surgical History:  Procedure Laterality Date  . ORCHIECTOMY Left 02/19/2015   Procedure: RADICAL ORCHIECTOMY;  Surgeon: Malen Gauze, MD;  Location: [facility];  Service: Urology;  Laterality: Left;    Family History  Problem Relation Age of Onset  . Hypertension Father   . Diabetes type II Father   . Leukemia Brother    Social History   Socioeconomic History  . Marital status: Single    Spouse name: Not on file  . Number of children: 1  . Years of education: Not on file  . Highest education level: Not on file  Occupational History  . Not on file  Tobacco Use  . Smoking status: Never Smoker  . Smokeless tobacco: Never Used  Vaping Use  . Vaping Use: Never used  Substance and Sexual Activity  . Alcohol use: Not on file    Comment: Drinks alcohol very infrequently. He typically consumes beer  . Drug use: Yes    Frequency: 0.2 times per week    Types: Marijuana  . Sexual activity: Not on file  Other Topics Concern  . Not on file  Social History Narrative  . Not on file   Social Determinants of Health   Financial Resource Strain:   . Difficulty of Paying Living Expenses: Not on file  Food Insecurity:   . Worried About Programme researcher, broadcasting/film/video in the Last Year: Not on file  . Ran Out of Food in the Last Year: Not on file  Transportation Needs:   . Lack of Transportation (Medical): Not on file  . Lack of Transportation (Non-Medical): Not on file  Physical Activity:   . Days of Exercise per Week: Not on file  . Minutes of Exercise per  Session: Not on file  Stress:   . Feeling of Stress : Not on file  Social Connections:   . Frequency of Communication with Friends and Family: Not on file  . Frequency of Social Gatherings with Friends and Family: Not on file  . Attends Religious Services: Not on file  . Active Member of Clubs or Organizations: Not on file  . Attends Banker Meetings: Not on file  . Marital Status: Not on file    Review of Systems  Constitutional: Negative for fatigue and fever.  HENT: Negative for congestion, ear pain, sinus pressure and sore throat.   Eyes: Negative for pain.  Respiratory: Negative for cough, chest tightness, shortness of breath and wheezing.   Cardiovascular: Negative for chest pain and palpitations.  Gastrointestinal: Negative for abdominal pain, constipation, diarrhea, nausea and vomiting.  Genitourinary: Negative for dysuria and hematuria.  Musculoskeletal: Negative for arthralgias, back pain, joint swelling and myalgias.  Skin: Negative for rash.  Neurological: Negative for dizziness, weakness and headaches.  Psychiatric/Behavioral: Negative for dysphoric mood. The patient is not nervous/anxious.      Objective:  There were no vitals taken for this visit.  BP/Weight 04/01/2020 03/02/2020 11/27/2019  Systolic BP - 128 142  Diastolic BP - 92 84  Wt. (Lbs) 520 505 503  BMI 66.76 64.84 64.58    Physical Exam Constitutional:      Appearance: Normal appearance.  HENT:     Head: Normocephalic.     Right Ear: Tympanic membrane and external ear normal.     Left Ear: Tympanic membrane and external ear normal.     Nose: Nose normal.     Mouth/Throat:     Mouth: Mucous membranes are moist.  Eyes:     Pupils: Pupils are equal, round, and reactive to light.  Cardiovascular:     Rate and Rhythm: Normal rate and regular rhythm.     Pulses: Normal pulses.          Dorsalis pedis pulses are 2+ on the right side and 2+ on the left side.       Posterior tibial pulses  are 2+ on the right side and 2+ on the left side.     Heart sounds: Normal heart sounds.  Pulmonary:     Effort: Pulmonary effort is normal.     Breath sounds: Normal breath sounds.  Abdominal:     General: Abdomen is flat. Bowel sounds are normal.     Palpations: Abdomen is soft.  Genitourinary:    Comments: Deferred Musculoskeletal:        General: Normal range of motion.     Cervical back: Normal range of motion.  Feet:     Right foot:     Protective Sensation: 10 sites tested. 10 sites sensed.     Skin integrity: Skin integrity normal.     Toenail Condition: Right toenails are normal.     Left foot:     Protective Sensation: 10 sites tested. 10 sites sensed.     Skin integrity: Skin integrity normal.     Toenail Condition: Left toenails are normal.  Skin:    General: Skin is warm and dry.     Capillary Refill: Capillary refill takes less than 2 seconds.  Neurological:     General: No focal deficit present.     Mental Status: He is alert and oriented to person, place, and time.  Psychiatric:        Mood and Affect: Mood normal.        Behavior: Behavior normal.     Diabetic Foot Exam - Simple   No data filed       Lab Results  Component Value Date   WBC 8.5 03/02/2020   HGB 17.0 03/02/2020   HCT 50.4 03/02/2020   PLT 193 03/02/2020   GLUCOSE 171 (H) 03/02/2020   CHOL 190 03/02/2020   TRIG 283 (H) 03/02/2020   HDL 43 03/02/2020   LDLCALC 99 03/02/2020   ALT 24 03/02/2020   AST 16 03/02/2020   NA 138 03/02/2020   K 4.0 03/02/2020   CL 97 03/02/2020   CREATININE 0.86 03/02/2020   BUN 16 03/02/2020   CO2 26 03/02/2020   HGBA1C 8.9 (H) 03/02/2020   MICROALBUR negative 11/27/2019      Assessment & Plan:   1.  Type 2 diabetes mellitus with other specified complication, without long-term current use of insulin (HCC) - CBC With Diff/Platelet - Comprehensive metabolic panel - Lipid panel - TSH - Hemoglobin A1c - Testosterone - pioglitazone (ACTOS) 15  MG tablet; Take 1 tablet (15 mg total) by mouth daily.  Dispense: 90 tablet; Refill: 1 - Dulaglutide (TRULICITY) 3 MG/0.5ML SOPN; Inject 3 mg as directed once a week.  Dispense: 2 mL; Refill: 1 - Continuous Blood Gluc Sensor (FREESTYLE LIBRE 2 SENSOR) MISC; Check sugars 3x a day before meals. E11.65  Dispense: 6 each; Refill: 0 - Cardiovascular Risk Assessment  2. Mixed hyperlipidemia - CBC With Diff/Platelet - Comprehensive metabolic panel - Lipid panel - TSH - Hemoglobin A1c - Dulaglutide (TRULICITY) 3 MG/0.5ML SOPN; Inject 3 mg as directed once a week.  Dispense: 2 mL; Refill: 1 - Cardiovascular Risk Assessment  3. Hypertension associated with diabetes (HCC) - CBC With Diff/Platelet - Comprehensive metabolic panel - Lipid panel - TSH - Hemoglobin A1c - valsartan (DIOVAN) 160 MG tablet; Take 1 tablet (160 mg total) by mouth daily.  Dispense: 30 tablet; Refill: 1 - amLODipine (NORVASC) 10 MG tablet; Take 1 tablet (10 mg total) by mouth daily.  Dispense: 90 tablet; Refill: 0 - Cardiovascular Risk Assessment  4. History of orchiectomy -testosterone level 5. Morbid obesity (HCC) - Cardiovascular Risk Assessment  -Consider Bariatric referral 6. Low testosterone in male -Consider Endocrinology referral . Decrease actos to 15 mg once daily. Increase trulicity 3 mg once weekly. Stop amlodipine/benazapril. Start amlodipine 10 mg once daily Start Valsartan 160 mg once daily. Return in 74-months for follow-up  Consume diabetic diet Monitor BP and glucose at home Walk 20 minutes daily, 3 times/week Start free style libre. Keep taking other medications as prescribed       Follow-up: 4-weeks Diabetes medication adjustment evaluation  An After Visit Summary was printed and given to the patient.  Flonnie Hailstone, DNP [facility] [phone]

## 2020-06-09 NOTE — Patient Instructions (Addendum)
Decrease actos to 15 mg once daily. Increase trulicity 3 mg once weekly. Stop amlodipine/benazapril. Start amlodipine 10 mg once daily Start Valsartan 160 mg once daily. Return in 39-month for follow-up  Consume diabetic diet Monitor BP and glucose at home Walk 20 minutes daily, 3 times/week Start free style libre. Keep taking other medications as prescribed  Preventive Care [id number] Old, Male Preventive care refers to lifestyle choices and visits with your health care provider that can promote health and wellness. This includes:  A yearly physical exam. This is also called an annual well check.  Regular dental and eye exams.  Immunizations.  Screening for certain conditions.  Healthy lifestyle choices, such as eating a healthy diet, getting regular exercise, not using drugs or products that contain nicotine and tobacco, and limiting alcohol use. What can I expect for my preventive care visit? Physical exam Your health care provider will check:  Height and weight. These may be used to calculate body mass index (BMI), which is a measurement that tells if you are at a healthy weight.  Heart rate and blood pressure.  Your skin for abnormal spots. Counseling Your health care provider may ask you questions about:  Alcohol, tobacco, and drug use.  Emotional well-being.  Home and relationship well-being.  Sexual activity.  Eating habits.  Work and work eStatistician What immunizations do I need?  Influenza (flu) vaccine  This is recommended every year. Tetanus, diphtheria, and pertussis (Tdap) vaccine  You may need a Td booster every 10 years. Varicella (chickenpox) vaccine  You may need this vaccine if you have not already been vaccinated. Human papillomavirus (HPV) vaccine  If recommended by your health care provider, you may need three doses over 6 months. Measles, mumps, and rubella (MMR) vaccine  You may need at least one dose of MMR. You may also need a  second dose. Meningococcal conjugate (MenACWY) vaccine  One dose is recommended if you are [age]years old and a fMarket researcherliving in a residence hall, or if you have one of several medical conditions. You may also need additional booster doses. Pneumococcal conjugate (PCV13) vaccine  You may need this if you have certain conditions and were not previously vaccinated. Pneumococcal polysaccharide (PPSV23) vaccine  You may need one or two doses if you smoke cigarettes or if you have certain conditions. Hepatitis A vaccine  You may need this if you have certain conditions or if you travel or work in places where you may be exposed to hepatitis A. Hepatitis B vaccine  You may need this if you have certain conditions or if you travel or work in places where you may be exposed to hepatitis B. Haemophilus influenzae type b (Hib) vaccine  You may need this if you have certain risk factors. You may receive vaccines as individual doses or as more than one vaccine together in one shot (combination vaccines). Talk with your health care provider about the risks and benefits of combination vaccines. What tests do I need? Blood tests  Lipid and cholesterol levels. These may be checked every 5 years starting at age 36  Hepatitis C test.  Hepatitis B test. Screening   Diabetes screening. This is done by checking your blood sugar (glucose) after you have not eaten for a while (fasting).  Sexually transmitted disease (STD) testing. Talk with your health care provider about your test results, treatment options, and if necessary, the need for more tests. Follow these instructions at home: Eating and drinking  Eat a diet that includes fresh fruits and vegetables, whole grains, lean protein, and low-fat dairy products.  Take vitamin and mineral supplements as recommended by your health care provider.  Do not drink alcohol if your health care provider tells you not to  drink.  If you drink alcohol: ? Limit how much you have to 0-2 drinks a day. ? Be aware of how much alcohol is in your drink. In the [place] one drink equals one 12 oz bottle of beer (355 mL), one 5 oz glass of wine (148 mL), or one 1 oz glass of hard liquor (44 mL). Lifestyle  Take daily care of your teeth and gums.  Stay active. Exercise for at least 30 minutes on 5 or more days each week.  Do not use any products that contain nicotine or tobacco, such as cigarettes, e-cigarettes, and chewing tobacco. If you need help quitting, ask your health care provider.  If you are sexually active, practice safe sex. Use a condom or other form of protection to prevent STIs (sexually transmitted infections). What's next?  Go to your health care provider once a year for a well check visit.  Ask your health care provider how often you should have your eyes and teeth checked.  Stay up to date on all vaccines. This information is not intended to replace advice given to you by your health care provider. Make sure you discuss any questions you have with your health care provider. Document Revised: 08/01/2018 Document Reviewed: 08/01/2018 Elsevier Patient Education  [place]. Diabetes Mellitus and Nutrition, Adult When you have diabetes (diabetes mellitus), it is very important to have healthy eating habits because your blood sugar (glucose) levels are greatly affected by what you eat and drink. Eating healthy foods in the appropriate amounts, at about the same times every day, can help you:  Control your blood glucose.  Lower your risk of heart disease.  Improve your blood pressure.  Reach or maintain a healthy weight. Every person with diabetes is different, and each person has different needs for a meal plan. Your health care provider may recommend that you work with a diet and nutrition specialist (dietitian) to make a meal plan that is best for you. Your meal plan may vary depending on  factors such as:  The calories you need.  The medicines you take.  Your weight.  Your blood glucose, blood pressure, and cholesterol levels.  Your activity level.  Other health conditions you have, such as heart or kidney disease. How do carbohydrates affect me? Carbohydrates, also called carbs, affect your blood glucose level more than any other type of food. Eating carbs naturally raises the amount of glucose in your blood. Carb counting is a method for keeping track of how many carbs you eat. Counting carbs is important to keep your blood glucose at a healthy level, especially if you use insulin or take certain oral diabetes medicines. It is important to know how many carbs you can safely have in each meal. This is different for every person. Your dietitian can help you calculate how many carbs you should have at each meal and for each snack. Foods that contain carbs include:  Bread, cereal, rice, pasta, and crackers.  Potatoes and corn.  Peas, beans, and lentils.  Milk and yogurt.  Fruit and juice.  Desserts, such as cakes, cookies, ice cream, and candy. How does alcohol affect me? Alcohol can cause a sudden decrease in blood glucose (hypoglycemia), especially if you use  insulin or take certain oral diabetes medicines. Hypoglycemia can be a life-threatening condition. Symptoms of hypoglycemia (sleepiness, dizziness, and confusion) are similar to symptoms of having too much alcohol. If your health care provider says that alcohol is safe for you, follow these guidelines:  Limit alcohol intake to no more than 1 drink per day for nonpregnant women and 2 drinks per day for men. One drink equals 12 oz of beer, 5 oz of wine, or 1 oz of hard liquor.  Do not drink on an empty stomach.  Keep yourself hydrated with water, diet soda, or unsweetened iced tea.  Keep in mind that regular soda, juice, and other mixers may contain a lot of sugar and must be counted as carbs. What are tips  for following this plan?  Reading food labels  Start by checking the serving size on the "Nutrition Facts" label of packaged foods and drinks. The amount of calories, carbs, fats, and other nutrients listed on the label is based on one serving of the item. Many items contain more than one serving per package.  Check the total grams (g) of carbs in one serving. You can calculate the number of servings of carbs in one serving by dividing the total carbs by 15. For example, if a food has 30 g of total carbs, it would be equal to 2 servings of carbs.  Check the number of grams (g) of saturated and trans fats in one serving. Choose foods that have low or no amount of these fats.  Check the number of milligrams (mg) of salt (sodium) in one serving. Most people should limit total sodium intake to less than 2,300 mg per day.  Always check the nutrition information of foods labeled as "low-fat" or "nonfat". These foods may be higher in added sugar or refined carbs and should be avoided.  Talk to your dietitian to identify your daily goals for nutrients listed on the label. Shopping  Avoid buying canned, premade, or processed foods. These foods tend to be high in fat, sodium, and added sugar.  Shop around the outside edge of the grocery store. This includes fresh fruits and vegetables, bulk grains, fresh meats, and fresh dairy. Cooking  Use low-heat cooking methods, such as baking, instead of high-heat cooking methods like deep frying.  Cook using healthy oils, such as olive, canola, or sunflower oil.  Avoid cooking with butter, cream, or high-fat meats. Meal planning  Eat meals and snacks regularly, preferably at the same times every day. Avoid going long periods of time without eating.  Eat foods high in fiber, such as fresh fruits, vegetables, beans, and whole grains. Talk to your dietitian about how many servings of carbs you can eat at each meal.  Eat 4-6 ounces (oz) of lean protein each  day, such as lean meat, chicken, fish, eggs, or tofu. One oz of lean protein is equal to: ? 1 oz of meat, chicken, or fish. ? 1 egg. ?  cup of tofu.  Eat some foods each day that contain healthy fats, such as avocado, nuts, seeds, and fish. Lifestyle  Check your blood glucose regularly.  Exercise regularly as told by your health care provider. This may include: ? 150 minutes of moderate-intensity or vigorous-intensity exercise each week. This could be brisk walking, biking, or water aerobics. ? Stretching and doing strength exercises, such as yoga or weightlifting, at least 2 times a week.  Take medicines as told by your health care provider.  Do not use any  products that contain nicotine or tobacco, such as cigarettes and e-cigarettes. If you need help quitting, ask your health care provider.  Work with a Social worker or diabetes educator to identify strategies to manage stress and any emotional and social challenges. Questions to ask a health care provider  Do I need to meet with a diabetes educator?  Do I need to meet with a dietitian?  What number can I call if I have questions?  When are the best times to check my blood glucose? Where to find more information:  American Diabetes Association: diabetes.org  Academy of Nutrition and Dietetics: www.eatright.[employer] of Diabetes and Digestive and Kidney Diseases (NIH): [employer] Summary  A healthy meal plan will help you control your blood glucose and maintain a healthy lifestyle.  Working with a diet and nutrition specialist (dietitian) can help you make a meal plan that is best for you.  Keep in mind that carbohydrates (carbs) and alcohol have immediate effects on your blood glucose levels. It is important to count carbs and to use alcohol carefully. This information is not intended to replace advice given to you by your health care provider. Make sure you discuss any questions you have with your  health care provider. Document Revised: 07/20/2017 Document Reviewed: 09/11/2016 Elsevier Patient Education  2020 [employer]. Hypertension, Adult Hypertension is another name for high blood pressure. High blood pressure forces your heart to work harder to pump blood. This can cause problems over time. There are two numbers in a blood pressure reading. There is a top number (systolic) over a bottom number (diastolic). It is best to have a blood pressure that is below 120/80. Healthy choices can help lower your blood pressure, or you may need medicine to help lower it. What are the causes? The cause of this condition is not known. Some conditions may be related to high blood pressure. What increases the risk?  Smoking.  Having type 2 diabetes mellitus, high cholesterol, or both.  Not getting enough exercise or physical activity.  Being overweight.  Having too much fat, sugar, calories, or salt (sodium) in your diet.  Drinking too much alcohol.  Having long-term (chronic) kidney disease.  Having a family history of high blood pressure.  Age. Risk increases with age.  Race. You may be at higher risk if you are African American.  Gender. Men are at higher risk than women before age 48. After age 60, women are at higher risk than men.  Having obstructive sleep apnea.  Stress. What are the signs or symptoms?  High blood pressure may not cause symptoms. Very high blood pressure (hypertensive crisis) may cause: ? Headache. ? Feelings of worry or nervousness (anxiety). ? Shortness of breath. ? Nosebleed. ? A feeling of being sick to your stomach (nausea). ? Throwing up (vomiting). ? Changes in how you see. ? Very bad chest pain. ? Seizures. How is this treated?  This condition is treated by making healthy lifestyle changes, such as: ? Eating healthy foods. ? Exercising more. ? Drinking less alcohol.  Your health care provider may prescribe medicine if lifestyle changes  are not enough to get your blood pressure under control, and if: ? Your top number is above 130. ? Your bottom number is above 80.  Your personal target blood pressure may vary. Follow these instructions at home: Eating and drinking   If told, follow the DASH eating plan. To follow this plan: ? Fill one half of your plate at each  meal with fruits and vegetables. ? Fill one fourth of your plate at each meal with whole grains. Whole grains include whole-wheat pasta, brown rice, and whole-grain bread. ? Eat or drink low-fat dairy products, such as skim milk or low-fat yogurt. ? Fill one fourth of your plate at each meal with low-fat (lean) proteins. Low-fat proteins include fish, chicken without skin, eggs, beans, and tofu. ? Avoid fatty meat, cured and processed meat, or chicken with skin. ? Avoid pre-made or processed food.  Eat less than 1,500 mg of salt each day.  Do not drink alcohol if: ? Your doctor tells you not to drink. ? You are pregnant, may be pregnant, or are planning to become pregnant.  If you drink alcohol: ? Limit how much you use to:  0-1 drink a day for women.  0-2 drinks a day for men. ? Be aware of how much alcohol is in your drink. In the [place] one drink equals one 12 oz bottle of beer (355 mL), one 5 oz glass of wine (148 mL), or one 1 oz glass of hard liquor (44 mL). Lifestyle   Work with your doctor to stay at a healthy weight or to lose weight. Ask your doctor what the best weight is for you.  Get at least 30 minutes of exercise most days of the week. This may include walking, swimming, or biking.  Get at least 30 minutes of exercise that strengthens your muscles (resistance exercise) at least 3 days a week. This may include lifting weights or doing Pilates.  Do not use any products that contain nicotine or tobacco, such as cigarettes, e-cigarettes, and chewing tobacco. If you need help quitting, ask your doctor.  Check your blood pressure at home as  told by your doctor.  Keep all follow-up visits as told by your doctor. This is important. Medicines  Take over-the-counter and prescription medicines only as told by your doctor. Follow directions carefully.  Do not skip doses of blood pressure medicine. The medicine does not work as well if you skip doses. Skipping doses also puts you at risk for problems.  Ask your doctor about side effects or reactions to medicines that you should watch for. Contact a doctor if you:  Think you are having a reaction to the medicine you are taking.  Have headaches that keep coming back (recurring).  Feel dizzy.  Have swelling in your ankles.  Have trouble with your vision. Get help right away if you:  Get a very bad headache.  Start to feel mixed up (confused).  Feel weak or numb.  Feel faint.  Have very bad pain in your: ? Chest. ? Belly (abdomen).  Throw up more than once.  Have trouble breathing. Summary  Hypertension is another name for high blood pressure.  High blood pressure forces your heart to work harder to pump blood.  For most people, a normal blood pressure is less than 120/80.  Making healthy choices can help lower blood pressure. If your blood pressure does not get lower with healthy choices, you may need to take medicine. This information is not intended to replace advice given to you by your health care provider. Make sure you discuss any questions you have with your health care provider. Document Revised: 04/17/2018 Document Reviewed: 04/17/2018 Elsevier Patient Education  2020 [employer].

## 2020-06-10 LAB — CBC WITH DIFF/PLATELET
Basophils Absolute: 0 10*3/uL (ref 0.0–0.2)
Basos: 1 %
EOS (ABSOLUTE): 0.1 10*3/uL (ref 0.0–0.4)
Eos: 2 %
Hematocrit: 48.7 % (ref 37.5–51.0)
Hemoglobin: 16.4 g/dL (ref 13.0–17.7)
Immature Grans (Abs): 0 10*3/uL (ref 0.0–0.1)
Immature Granulocytes: 1 %
Lymphocytes Absolute: 1.9 10*3/uL (ref 0.7–3.1)
Lymphs: 30 %
MCH: 28.7 pg (ref 26.6–33.0)
MCHC: 33.7 g/dL (ref 31.5–35.7)
MCV: 85 fL (ref 79–97)
Monocytes Absolute: 0.6 10*3/uL (ref 0.1–0.9)
Monocytes: 9 %
Neutrophils Absolute: 3.9 10*3/uL (ref 1.4–7.0)
Neutrophils: 57 %
Platelets: 178 10*3/uL (ref 150–450)
RBC: 5.72 x10E6/uL (ref 4.14–5.80)
RDW: 14.5 % (ref 11.6–15.4)
WBC: 6.5 10*3/uL (ref 3.4–10.8)

## 2020-06-10 LAB — COMPREHENSIVE METABOLIC PANEL
ALT: 29 IU/L (ref 0–44)
AST: 19 IU/L (ref 0–40)
Albumin/Globulin Ratio: 1.8 (ref 1.2–2.2)
Albumin: 4.6 g/dL (ref 4.0–5.0)
Alkaline Phosphatase: 65 IU/L (ref 44–121)
BUN/Creatinine Ratio: 14 (ref 9–20)
BUN: 13 mg/dL (ref 6–20)
Bilirubin Total: 0.5 mg/dL (ref 0.0–1.2)
CO2: 21 mmol/L (ref 20–29)
Calcium: 10 mg/dL (ref 8.7–10.2)
Chloride: 99 mmol/L (ref 96–106)
Creatinine, Ser: 0.95 mg/dL (ref 0.76–1.27)
GFR calc Af Amer: 119 mL/min/{1.73_m2} (ref >59)
GFR calc non Af Amer: 103 mL/min/{1.73_m2} (ref >59)
Globulin, Total: 2.6 g/dL (ref 1.5–4.5)
Glucose: 163 mg/dL — ABNORMAL HIGH (ref 65–99)
Potassium: 4.7 mmol/L (ref 3.5–5.2)
Sodium: 140 mmol/L (ref 134–144)
Total Protein: 7.2 g/dL (ref 6.0–8.5)

## 2020-06-10 LAB — LIPID PANEL
Chol/HDL Ratio: 4.1 ratio (ref 0.0–5.0)
Cholesterol, Total: 210 mg/dL — ABNORMAL HIGH (ref 100–199)
HDL: 51 mg/dL (ref >39)
LDL Chol Calc (NIH): 119 mg/dL — ABNORMAL HIGH (ref 0–99)
Triglycerides: 230 mg/dL — ABNORMAL HIGH (ref 0–149)
VLDL Cholesterol Cal: 40 mg/dL (ref 5–40)

## 2020-06-10 LAB — TSH: TSH: 3.04 u[IU]/mL (ref 0.450–4.500)

## 2020-06-10 LAB — TESTOSTERONE: Testosterone: 136 ng/dL — ABNORMAL LOW (ref 264–916)

## 2020-06-10 LAB — HEMOGLOBIN A1C
Est. average glucose Bld gHb Est-mCnc: 192 mg/dL
Hgb A1c MFr Bld: 8.3 % — ABNORMAL HIGH (ref 4.8–5.6)

## 2020-06-10 LAB — CARDIOVASCULAR RISK ASSESSMENT

## 2020-06-11 ENCOUNTER — Encounter: Payer: Self-pay | Admitting: Nurse Practitioner

## 2020-06-14 DIAGNOSIS — Z9079 Acquired absence of other genital organ(s): Secondary | ICD-10-CM | POA: Insufficient documentation

## 2020-06-14 DIAGNOSIS — R7989 Other specified abnormal findings of blood chemistry: Secondary | ICD-10-CM | POA: Insufficient documentation

## 2020-07-09 ENCOUNTER — Ambulatory Visit: Payer: BLUE CROSS/BLUE SHIELD | Admitting: Nurse Practitioner

## 2020-07-13 ENCOUNTER — Ambulatory Visit: Payer: BLUE CROSS/BLUE SHIELD | Admitting: Physician Assistant

## 2020-07-13 ENCOUNTER — Other Ambulatory Visit: Payer: Self-pay

## 2020-07-13 ENCOUNTER — Encounter: Payer: Self-pay | Admitting: Physician Assistant

## 2020-07-13 VITALS — BP 130/78 | HR 87 | Temp 97.3°F | Ht 74.0 in | Wt >= 6400 oz

## 2020-07-13 DIAGNOSIS — E1169 Type 2 diabetes mellitus with other specified complication: Secondary | ICD-10-CM

## 2020-07-13 DIAGNOSIS — E782 Mixed hyperlipidemia: Secondary | ICD-10-CM

## 2020-07-13 DIAGNOSIS — E1159 Type 2 diabetes mellitus with other circulatory complications: Secondary | ICD-10-CM

## 2020-07-13 DIAGNOSIS — I152 Hypertension secondary to endocrine disorders: Secondary | ICD-10-CM | POA: Diagnosis not present

## 2020-07-13 MED ORDER — TRULICITY 3 MG/0.5ML ~~LOC~~ SOAJ: 3.0000 mg | SUBCUTANEOUS | 1 refills | Status: AC

## 2020-07-13 NOTE — Assessment & Plan Note (Signed)
Continue current meds as directed 

## 2020-07-13 NOTE — Assessment & Plan Note (Signed)
Continue meds and start trulicity 3mg 

## 2020-07-13 NOTE — Progress Notes (Signed)
Acute Office Visit  Subjective:    Patient ID: William Massey, male    DOB: 02-Jul-1986, 34 y.o.   MRN: [id number]  Chief Complaint  Patient presents with  . Diabetes    follow up    HPI Patient is in today for follow up on diabetes - last a1c was one month ago and was 8.3 - other provider decreased his actos and increased his trulicity from 1.5 to 3 however he has not yet picked up that medication States fasting glucose ranging 140-150 and postprandial up to 200  Pt also here to recheck bp - his bp meds were changed to norvasc 10mg  qd and diovan 160mg  qd - bp today looking much better - denies chest pain/sob  Past Medical History:  Diagnosis Date  . Asthma   . Diabetes mellitus without complication (HCC)   . Generalized anxiety disorder   . Hypertension   . Mild intermittent asthma, uncomplicated   . Mixed hyperlipidemia   . Morbid (severe) obesity due to excess calories (HCC)   . Personal history of malignant neoplasm of testis     Past Surgical History:  Procedure Laterality Date  . ORCHIECTOMY Left 02/19/2015   Procedure: RADICAL ORCHIECTOMY;  Surgeon: , MD;  Location: [facility];  Service: Urology;  Laterality: Left;    Family History  Problem Relation Age of Onset  . Hypertension Father   . Diabetes type II Father   . Leukemia Brother     Social History   Socioeconomic History  . Marital status: Single    Spouse name: Not on file  . Number of children: 1  . Years of education: Not on file  . Highest education level: Not on file  Occupational History  . Not on file  Tobacco Use  . Smoking status: Never Smoker  . Smokeless tobacco: Never Used  Vaping Use  . Vaping Use: Never used  Substance and Sexual Activity  . Alcohol use: Not on file    Comment: Drinks alcohol very infrequently. He typically consumes beer  . Drug use: Yes    Frequency: 0.2 times per week    Types: Marijuana  . Sexual activity: Not on file  Other Topics Concern  .  Not on file  Social History Narrative  . Not on file   Social Determinants of Health   Financial Resource Strain:   . Difficulty of Paying Living Expenses: Not on file  Food Insecurity:   . Worried About 04/22/2015 in the Last Year: Not on file  . Ran Out of Food in the Last Year: Not on file  Transportation Needs:   . Lack of Transportation (Medical): Not on file  . Lack of Transportation (Non-Medical): Not on file  Physical Activity:   . Days of Exercise per Week: Not on file  . Minutes of Exercise per Session: Not on file  Stress:   . Feeling of Stress : Not on file  Social Connections:   . Frequency of Communication with Friends and Family: Not on file  . Frequency of Social Gatherings with Friends and Family: Not on file  . Attends Religious Services: Not on file  . Active Member of Clubs or Organizations: Not on file  . Attends [employer] Meetings: Not on file  . Marital Status: Not on file  Intimate Partner Violence:   . Fear of Current or Ex-Partner: Not on file  . Emotionally Abused: Not on file  . Physically Abused:  Not on file  . Sexually Abused: Not on file     Current Outpatient Medications:  .  albuterol (VENTOLIN HFA) 108 (90 Base) MCG/ACT inhaler, Inhale 1-2 puffs into the lungs every 6 (six) hours as needed for wheezing or shortness of breath., Disp: , Rfl:  .  amLODipine (NORVASC) 10 MG tablet, Take 1 tablet (10 mg total) by mouth William., Disp: 90 tablet, Rfl: 0 .  atorvastatin (LIPITOR) 40 MG tablet, Take 1 tablet (40 mg total) by mouth William., Disp: 90 tablet, Rfl: 1 .  Continuous Blood Gluc Sensor (FREESTYLE LIBRE 2 SENSOR) MISC, Check sugars 3x a day before meals. E11.65, Disp: 6 each, Rfl: 0 .  hydrochlorothiazide (HYDRODIURIL) 25 MG tablet, TAKE 1 TABLET BY MOUTH William, Disp: 100 tablet, Rfl: 0 .  JARDIANCE 25 MG TABS tablet, Take 25 mg by mouth every morning., Disp: 90 tablet, Rfl: 1 .  metoprolol (TOPROL-XL) 200 MG 24 hr tablet,  Take 1 tablet (200 mg total) by mouth William., Disp: 90 tablet, Rfl: 0 .  pioglitazone (ACTOS) 15 MG tablet, Take 1 tablet (15 mg total) by mouth William., Disp: 90 tablet, Rfl: 1 .  valsartan (DIOVAN) 160 MG tablet, Take 1 tablet (160 mg total) by mouth William., Disp: 30 tablet, Rfl: 1 .  Dulaglutide (TRULICITY) 3 MG/0.5ML SOPN, Inject 3 mg as directed once a week., Disp: 2 mL, Rfl: 1   Allergies  Allergen Reactions  . Metformin And Related Nausea Only    CONSTITUTIONAL: Negative for chills, fatigue, fever, unintentional weight gain and unintentional weight loss.  CARDIOVASCULAR: Negative for chest pain, dizziness, palpitations and pedal edema.  RESPIRATORY: Negative for recent cough and dyspnea.  GASTROINTESTINAL: Negative for abdominal pain, acid reflux symptoms, constipation, diarrhea, nausea and vomiting.  NEUROLOGICAL: Negative for dizziness and headaches.  PSYCHIATRIC: Negative for sleep disturbance and to question depression screen.  Negative for depression, negative for anhedonia.         Objective:    PHYSICAL EXAM:   VS: BP 130/78 (BP Location: Left Arm, Patient Position: Sitting, Cuff Size: Large)   Pulse 87   Temp (!) 97.3 F (36.3 C) (Temporal)   Ht 6\' 2"  (1.88 m)   Wt (!) 505 lb (229.1 kg)   SpO2 97%   BMI 64.84 kg/m   GEN: Well nourished, well developed, in no acute distress   Cardiac: RRR; no murmurs, rubs, or gallops,no edema - Respiratory:  normal respiratory rate and pattern with no distress - normal breath sounds with no rales, rhonchi, wheezes or rubs  Skin: warm and dry, no rash   Psych: euthymic mood, appropriate affect and demeanor   Wt Readings from Last 3 Encounters:  07/13/20 (!) 505 lb (229.1 kg)  06/09/20 (!) 500 lb (226.8 kg)  04/01/20 (!) 520 lb (235.9 kg)    Health Maintenance Due  Topic Date Due  . OPHTHALMOLOGY EXAM  Never done    There are no preventive care reminders to display for this patient.        Assessment & Plan:    Problem List Items Addressed This Visit      Cardiovascular and Mediastinum   Hypertension associated with diabetes (HCC) - Primary    Continue current meds as directed      Relevant Medications   Dulaglutide (TRULICITY) 3 MG/0.5ML SOPN     Endocrine   Type 2 diabetes mellitus (HCC)    Continue meds and start trulicity 3mg       Relevant Medications   Dulaglutide (  TRULICITY) 3 MG/0.5ML SOPN     Other   Mixed hyperlipidemia   Relevant Medications   Dulaglutide (TRULICITY) 3 MG/0.5ML SOPN       Meds ordered this encounter  Medications  . Dulaglutide (TRULICITY) 3 MG/0.5ML SOPN    Sig: Inject 3 mg as directed once a week.    Dispense:  2 mL    Refill:  1    Order Specific Question:   Supervising Provider    Answer:   COX, KIRSTEN [[id number]]     SARA R Jazzmyn Filion, PA-C

## 2020-07-27 ENCOUNTER — Encounter: Payer: Self-pay | Admitting: Internal Medicine

## 2020-08-04 ENCOUNTER — Other Ambulatory Visit: Payer: Self-pay | Admitting: Physician Assistant

## 2020-09-15 ENCOUNTER — Encounter: Payer: Self-pay | Admitting: Physician Assistant

## 2020-09-15 ENCOUNTER — Other Ambulatory Visit: Payer: Self-pay

## 2020-09-15 ENCOUNTER — Ambulatory Visit: Payer: BLUE CROSS/BLUE SHIELD | Admitting: Physician Assistant

## 2020-09-15 VITALS — BP 136/82 | HR 84 | Temp 97.5°F | Ht 74.0 in | Wt >= 6400 oz

## 2020-09-15 DIAGNOSIS — E1159 Type 2 diabetes mellitus with other circulatory complications: Secondary | ICD-10-CM | POA: Diagnosis not present

## 2020-09-15 DIAGNOSIS — E782 Mixed hyperlipidemia: Secondary | ICD-10-CM

## 2020-09-15 DIAGNOSIS — I152 Hypertension secondary to endocrine disorders: Secondary | ICD-10-CM

## 2020-09-15 DIAGNOSIS — E1165 Type 2 diabetes mellitus with hyperglycemia: Secondary | ICD-10-CM

## 2020-09-15 MED ORDER — METOPROLOL SUCCINATE ER 200 MG PO TB24: 200.0000 mg | ORAL_TABLET | Freq: Every day | ORAL | 0 refills | Status: DC

## 2020-09-15 MED ORDER — AMLODIPINE BESYLATE 10 MG PO TABS: 10.0000 mg | ORAL_TABLET | Freq: Every day | ORAL | 0 refills | Status: DC

## 2020-09-15 MED ORDER — JARDIANCE 25 MG PO TABS: 25.0000 mg | ORAL_TABLET | Freq: Every morning | ORAL | 1 refills | Status: DC

## 2020-09-15 MED ORDER — VALSARTAN 160 MG PO TABS: 160.0000 mg | ORAL_TABLET | Freq: Every day | ORAL | 1 refills | Status: DC

## 2020-09-15 MED ORDER — ATORVASTATIN CALCIUM 40 MG PO TABS: 40.0000 mg | ORAL_TABLET | Freq: Every day | ORAL | 1 refills | Status: DC

## 2020-09-15 NOTE — Progress Notes (Signed)
Established Patient Office Visit  Subjective:  Patient ID: William Massey, male    DOB: 1985/09/26  Age: 35 y.o. MRN: [id number]  CC:  Chief Complaint  Patient presents with  . diabetes    HPI William Massey presents for follow up diabetes     Patient presents with type 2 diabetes mellitus with other specified complication.  Specifically, this is type 2, non-insulin requiring diabetes, complicated by hypertension, obesity, and hyperlipidemia.  Date of diagnosis 2014.  Compliance with treatment has been fair;   He specifically denies associated symptoms, including blurred vision, fatigue, headache, hypoglycemia, nocturia, peripheral neuropathy, excessive thirst and frequent urination.  Depression screen is performed and is negative.   Tobacco screen: Non-smoker.  Current meds include an oral hypoglycemic ( Jardiance 25 mg QD ),actos 74m qd  insulin/injectable ( Trulicity 3.0 mg injection once a week ),  Lotensin, and lipid lowering agent ( Lipitor ). He states his fasting glucose ranges are in the 100s and after meals low 200s    Pt presents for follow up of hypertension.  Date of diagnosis 2014.  His current cardiac medication regimen includes , a beta-blocker ( Metoprolol ER 200 mg QD ), and lotrel 10/40 and also hctz 276mqd He has not kept a blood pressure diary, but states that typical readings show systolics in the 14701-779ange and diastolics in the 9039-030ange.  Compliance with treatment has been fair; he does not follow a diet and exercise regimen .  Concurrent health problems include hyperlipidemia and diabetes.   Pt presents with hyperlipidemia.  Date of diagnosis 01/24/2019.  Current treatment includes Lipitor.  Compliance with treatment has been good; he takes his medication as directed, maintains his low cholesterol diet, and follows up as directed.  He denies experiencing any hypercholesterolemia related symptoms.  Concurrent health problems include hypertension and diabetes.  Currently on lipitor 4090md      Past Medical History:  Diagnosis Date  . Asthma   . Diabetes mellitus without complication ([facility] . Generalized anxiety disorder   . Hypertension   . Mild intermittent asthma, uncomplicated   . Mixed hyperlipidemia   . Morbid (severe) obesity due to excess calories ([facility] . Personal history of malignant neoplasm of testis     Past Surgical History:  Procedure Laterality Date  . ORCHIECTOMY Left 02/19/2015   Procedure: RADICAL ORCHIECTOMY;  Surgeon: PatCleon GustinD;  Location: [facility];  Service: Urology;  Laterality: Left;    Family History  Problem Relation Age of Onset  . Hypertension Father   . Diabetes type II Father   . Leukemia Brother     Social History   Socioeconomic History  . Marital status: Single    Spouse name: Not on file  . Number of children: 1  . Years of education: Not on file  . Highest education level: Not on file  Occupational History  . Not on file  Tobacco Use  . Smoking status: Never Smoker  . Smokeless tobacco: Never Used  Vaping Use  . Vaping Use: Never used  Substance and Sexual Activity  . Alcohol use: Not on file    Comment: Drinks alcohol very infrequently. He typically consumes beer  . Drug use: Yes    Frequency: 0.2 times per week    Types: Marijuana  . Sexual activity: Not on file  Other Topics Concern  . Not on file  Social History Narrative  . Not on file   Social  Determinants of Health   Financial Resource Strain: Not on file  Food Insecurity: Not on file  Transportation Needs: Not on file  Physical Activity: Not on file  Stress: Not on file  Social Connections: Not on file  Intimate Partner Violence: Not on file     Current Outpatient Medications:  .  albuterol (VENTOLIN HFA) 108 (90 Base) MCG/ACT inhaler, Inhale 1-2 puffs into the lungs every 6 (six) hours as needed for wheezing or shortness of breath., Disp: , Rfl:  .  Continuous Blood Gluc Sensor (FREESTYLE LIBRE 2  SENSOR) MISC, Check sugars 3x a day before meals. E11.65, Disp: 6 each, Rfl: 0 .  Dulaglutide (TRULICITY) 3 ST/4.1DQ SOPN, Inject into the skin., Disp: , Rfl:  .  hydrochlorothiazide (HYDRODIURIL) 25 MG tablet, TAKE 1 TABLET BY MOUTH DAILY, Disp: 100 tablet, Rfl: 0 .  amLODipine (NORVASC) 10 MG tablet, Take 1 tablet (10 mg total) by mouth daily., Disp: 90 tablet, Rfl: 0 .  atorvastatin (LIPITOR) 40 MG tablet, Take 1 tablet (40 mg total) by mouth daily., Disp: 90 tablet, Rfl: 1 .  JARDIANCE 25 MG TABS tablet, Take 1 tablet (25 mg total) by mouth every morning., Disp: 90 tablet, Rfl: 1 .  metoprolol (TOPROL-XL) 200 MG 24 hr tablet, Take 1 tablet (200 mg total) by mouth daily., Disp: 90 tablet, Rfl: 0 .  pioglitazone (ACTOS) 15 MG tablet, Take 1 tablet (15 mg total) by mouth daily., Disp: 90 tablet, Rfl: 1 .  valsartan (DIOVAN) 160 MG tablet, Take 1 tablet (160 mg total) by mouth daily., Disp: 30 tablet, Rfl: 1   Allergies  Allergen Reactions  . Metformin And Related Nausea Only    ROS CONSTITUTIONAL: Negative for chills, fatigue, fever, unintentional weight gain and unintentional weight loss.  E/N/T: Negative for ear pain, nasal congestion and sore throat.  CARDIOVASCULAR: Negative for chest pain, dizziness, palpitations and pedal edema.  RESPIRATORY: Negative for recent cough and dyspnea.  GASTROINTESTINAL: Negative for abdominal pain, acid reflux symptoms, constipation, diarrhea, nausea and vomiting.  MSK: Negative for arthralgias and myalgias.  INTEGUMENTARY: Negative for rash.  NEUROLOGICAL: Negative for dizziness and headaches.  PSYCHIATRIC: Negative for sleep disturbance and to question depression screen.  Negative for depression, negative for anhedonia.            Objective:    PHYSICAL EXAM:   VS: BP 136/82 (BP Location: Left Arm, Patient Position: Sitting, Cuff Size: Normal)   Pulse 84   Temp (!) 97.5 F (36.4 C) (Temporal)   Ht _0  (1.88 m)   Wt (!) 505 lb (229.1 kg)    SpO2 97%   BMI 64.84 kg/m   PHYSICAL EXAM:   VS: BP 136/82 (BP Location: Left Arm, Patient Position: Sitting, Cuff Size: Normal)   Pulse 84   Temp (!) 97.5 F (36.4 C) (Temporal)   Ht _1  (1.88 m)   Wt (!) 505 lb (229.1 kg)   SpO2 97%   BMI 64.84 kg/m   GEN: Well nourished, well developed, in no acute distress morbidly obese  Cardiac: RRR; no murmurs, rubs, or gallops,no edema -Respiratory:  normal respiratory rate and pattern with no distress - normal breath sounds with no rales, rhonchi, wheezes or rubs  MS: no deformity or atrophy  Skin: warm and dry, no rash   Psych: euthymic mood, appropriate affect and demeanor    Diabetic Foot Exam - Simple   No data filed     No visits with results within 1 Day(s)  from this visit.  Latest known visit with results is:  Office Visit on 06/09/2020  Component Date Value Ref Range Status  . WBC 06/09/2020 6.5  3.4 - 10.8 x10E3/uL Final  . RBC 06/09/2020 5.72  4.14 - 5.80 x10E6/uL Final  . Hemoglobin 06/09/2020 16.4  13.0 - 17.7 g/dL Final  . Hematocrit 06/09/2020 48.7  37.5 - 51.0 % Final  . MCV 06/09/2020 85  79 - 97 fL Final  . MCH 06/09/2020 28.7  26.6 - 33.0 pg Final  . MCHC 06/09/2020 33.7  31.5 - 35.7 g/dL Final  . RDW 06/09/2020 14.5  11.6 - 15.4 % Final  . Platelets 06/09/2020 178  150 - 450 x10E3/uL Final  . Neutrophils 06/09/2020 57  Not Estab. % Final  . Lymphs 06/09/2020 30  Not Estab. % Final  . Monocytes 06/09/2020 9  Not Estab. % Final  . Eos 06/09/2020 2  Not Estab. % Final  . Basos 06/09/2020 1  Not Estab. % Final  . Neutrophils Absolute 06/09/2020 3.9  1.4 - 7.0 x10E3/uL Final  . Lymphocytes Absolute 06/09/2020 1.9  0.7 - 3.1 x10E3/uL Final  . Monocytes Absolute 06/09/2020 0.6  0.1 - 0.9 x10E3/uL Final  . EOS (ABSOLUTE) 06/09/2020 0.1  0.0 - 0.4 x10E3/uL Final  . Basophils Absolute 06/09/2020 0.0  0.0 - 0.2 x10E3/uL Final  . Immature Granulocytes 06/09/2020 1  Not Estab. % Final  . Immature Grans (Abs)  06/09/2020 0.0  0.0 - 0.1 x10E3/uL Final  . Glucose 06/09/2020 163* 65 - 99 mg/dL Final  . BUN 06/09/2020 13  6 - 20 mg/dL Final  . Creatinine, Ser 06/09/2020 0.95  0.76 - 1.27 mg/dL Final  . GFR calc non Af Amer 06/09/2020 103  >59 mL/min/1.73 Final  . GFR calc Af Amer 06/09/2020 119  >59 mL/min/1.73 Final   Comment: **In accordance with recommendations from the NKF-ASN Task force,**   [facility] is in the process of updating its eGFR calculation to the   2021 CKD-EPI creatinine equation that estimates kidney function   without a race variable.   . BUN/Creatinine Ratio 06/09/2020 14  9 - 20 Final  . Sodium 06/09/2020 140  134 - 144 mmol/L Final  . Potassium 06/09/2020 4.7  3.5 - 5.2 mmol/L Final  . Chloride 06/09/2020 99  96 - 106 mmol/L Final  . CO2 06/09/2020 21  20 - 29 mmol/L Final  . Calcium 06/09/2020 10.0  8.7 - 10.2 mg/dL Final  . Total Protein 06/09/2020 7.2  6.0 - 8.5 g/dL Final  . Albumin 06/09/2020 4.6  4.0 - 5.0 g/dL Final  . Globulin, Total 06/09/2020 2.6  1.5 - 4.5 g/dL Final  . Albumin/Globulin Ratio 06/09/2020 1.8  1.2 - 2.2 Final  . Bilirubin Total 06/09/2020 0.5  0.0 - 1.2 mg/dL Final  . Alkaline Phosphatase 06/09/2020 65  44 - 121 IU/L Final                 **Please note reference interval change**  . AST 06/09/2020 19  0 - 40 IU/L Final  . ALT 06/09/2020 29  0 - 44 IU/L Final  . Cholesterol, Total 06/09/2020 210* 100 - 199 mg/dL Final  . Triglycerides 06/09/2020 230* 0 - 149 mg/dL Final  . HDL 06/09/2020 51  >39 mg/dL Final  . VLDL Cholesterol Cal 06/09/2020 40  5 - 40 mg/dL Final  . LDL Chol Calc (NIH) 06/09/2020 119* 0 - 99 mg/dL Final  . Chol/HDL Ratio 06/09/2020 4.1  0.0 -  5.0 ratio Final   Comment:                                   T. Chol/HDL Ratio                                             Men  Women                               1/2 Avg.Risk  3.4    3.3                                   Avg.Risk  5.0    4.4                                2X Avg.Risk   9.6    7.1                                3X Avg.Risk 23.4   11.0   . TSH 06/09/2020 3.040  0.450 - 4.500 uIU/mL Final  . Hgb A1c MFr Bld 06/09/2020 8.3* 4.8 - 5.6 % Final   Comment:          Prediabetes: 5.7 - 6.4          Diabetes: >6.4          Glycemic control for adults with diabetes: <7.0   . Est. average glucose Bld gHb Est-m* 06/09/2020 192  mg/dL Final  . Testosterone 06/09/2020 136* 264 - 916 ng/dL Final   Comment: Adult male reference interval is based on a population of healthy nonobese males (BMI <30) between 10 and 14 years old. [place], [place] [phone]. PMID: [id number].   . Interpretation 06/09/2020 Note   Final   Supplemental report is available.    BP 136/82 (BP Location: Left Arm, Patient Position: Sitting, Cuff Size: Normal)   Pulse 84   Temp (!) 97.5 F (36.4 C) (Temporal)   Ht _0  (1.88 m)   Wt (!) 505 lb (229.1 kg)   SpO2 97%   BMI 64.84 kg/m  Wt Readings from Last 3 Encounters:  09/15/20 (!) 505 lb (229.1 kg)  07/13/20 (!) 505 lb (229.1 kg)  06/09/20 (!) 500 lb (226.8 kg)     There are no preventive care reminders to display for this patient.  There are no preventive care reminders to display for this patient.  Lab Results  Component Value Date   TSH 3.040 06/09/2020   Lab Results  Component Value Date   WBC 6.5 06/09/2020   HGB 16.4 06/09/2020   HCT 48.7 06/09/2020   MCV 85 06/09/2020   PLT 178 06/09/2020   Lab Results  Component Value Date   NA 140 06/09/2020   K 4.7 06/09/2020   CO2 21 06/09/2020   GLUCOSE 163 (H) 06/09/2020   BUN 13 06/09/2020   CREATININE 0.95 06/09/2020   BILITOT 0.5 06/09/2020   ALKPHOS 65 06/09/2020   AST 19 06/09/2020   ALT 29 06/09/2020   PROT 7.2 06/09/2020  ALBUMIN 4.6 06/09/2020   CALCIUM 10.0 06/09/2020   ANIONGAP 7 07/20/2015   Lab Results  Component Value Date   CHOL 210 (H) 06/09/2020   Lab Results  Component Value Date   HDL 51 06/09/2020   Lab Results  Component  Value Date   LDLCALC 119 (H) 06/09/2020   Lab Results  Component Value Date   TRIG 230 (H) 06/09/2020   Lab Results  Component Value Date   CHOLHDL 4.1 06/09/2020   Lab Results  Component Value Date   HGBA1C 8.3 (H) 06/09/2020      Assessment & Plan:   Problem List Items Addressed This Visit      Cardiovascular and Mediastinum   Hypertension associated with diabetes ([place]) - Primary   Relevant Medications   Dulaglutide (TRULICITY) 3 JO/8.7OM SOPN   amLODipine (NORVASC) 10 MG tablet   atorvastatin (LIPITOR) 40 MG tablet   JARDIANCE 25 MG TABS tablet   metoprolol (TOPROL-XL) 200 MG 24 hr tablet   valsartan (DIOVAN) 160 MG tablet   Other Relevant Orders   CBC with Differential/Platelet   Comprehensive metabolic panel     Endocrine   Type 2 diabetes mellitus (HCC)   Relevant Medications   Dulaglutide (TRULICITY) 3 VE/7.2CN SOPN   atorvastatin (LIPITOR) 40 MG tablet   JARDIANCE 25 MG TABS tablet   valsartan (DIOVAN) 160 MG tablet   Other Relevant Orders   CBC with Differential/Platelet   Comprehensive metabolic panel   Lipid panel   Hemoglobin A1c     Other   Mixed hyperlipidemia   Relevant Medications   amLODipine (NORVASC) 10 MG tablet   atorvastatin (LIPITOR) 40 MG tablet   metoprolol (TOPROL-XL) 200 MG 24 hr tablet   valsartan (DIOVAN) 160 MG tablet   Other Relevant Orders   Lipid panel      Meds ordered this encounter  Medications  . amLODipine (NORVASC) 10 MG tablet    Sig: Take 1 tablet (10 mg total) by mouth daily.    Dispense:  90 tablet    Refill:  0    Order Specific Question:   Supervising Provider    AnswerRochel Brome [id number]  . atorvastatin (LIPITOR) 40 MG tablet    Sig: Take 1 tablet (40 mg total) by mouth daily.    Dispense:  90 tablet    Refill:  1    Order Specific Question:   Supervising Provider    AnswerRochel Brome [id number]  . JARDIANCE 25 MG TABS tablet    Sig: Take 1 tablet (25 mg total) by mouth every morning.     Dispense:  90 tablet    Refill:  1    Order Specific Question:   Supervising Provider    AnswerRochel Brome [id number]  . metoprolol (TOPROL-XL) 200 MG 24 hr tablet    Sig: Take 1 tablet (200 mg total) by mouth daily.    Dispense:  90 tablet    Refill:  0    Order Specific Question:   Supervising Provider    AnswerRochel Brome [id number]  . valsartan (DIOVAN) 160 MG tablet    Sig: Take 1 tablet (160 mg total) by mouth daily.    Dispense:  30 tablet    Refill:  1    Order Specific Question:   Supervising Provider    AnswerShelton Silvas    Follow-up: Return in about 3 months (around 12/14/2020) for THIS IS  A 30 MIN pt --- chronic fasting follow up.    SARA R Pecola Haxton, PA-C

## 2020-09-16 LAB — COMPREHENSIVE METABOLIC PANEL
ALT: 28 IU/L (ref 0–44)
AST: 20 IU/L (ref 0–40)
Albumin/Globulin Ratio: 1.8 (ref 1.2–2.2)
Albumin: 4.4 g/dL (ref 4.0–5.0)
Alkaline Phosphatase: 76 IU/L (ref 44–121)
BUN/Creatinine Ratio: 18 (ref 9–20)
BUN: 16 mg/dL (ref 6–20)
Bilirubin Total: 0.7 mg/dL (ref 0.0–1.2)
CO2: 25 mmol/L (ref 20–29)
Calcium: 9.9 mg/dL (ref 8.7–10.2)
Chloride: 99 mmol/L (ref 96–106)
Creatinine, Ser: 0.87 mg/dL (ref 0.76–1.27)
GFR calc Af Amer: 128 mL/min/{1.73_m2} (ref >59)
GFR calc non Af Amer: 111 mL/min/{1.73_m2} (ref >59)
Globulin, Total: 2.5 g/dL (ref 1.5–4.5)
Glucose: 157 mg/dL — ABNORMAL HIGH (ref 65–99)
Potassium: 4.3 mmol/L (ref 3.5–5.2)
Sodium: 139 mmol/L (ref 134–144)
Total Protein: 6.9 g/dL (ref 6.0–8.5)

## 2020-09-16 LAB — CBC WITH DIFFERENTIAL/PLATELET
Basophils Absolute: 0.1 10*3/uL (ref 0.0–0.2)
Basos: 1 %
EOS (ABSOLUTE): 0.1 10*3/uL (ref 0.0–0.4)
Eos: 1 %
Hematocrit: 52.4 % — ABNORMAL HIGH (ref 37.5–51.0)
Hemoglobin: 17.6 g/dL (ref 13.0–17.7)
Immature Grans (Abs): 0 10*3/uL (ref 0.0–0.1)
Immature Granulocytes: 0 %
Lymphocytes Absolute: 2.1 10*3/uL (ref 0.7–3.1)
Lymphs: 31 %
MCH: 28.3 pg (ref 26.6–33.0)
MCHC: 33.6 g/dL (ref 31.5–35.7)
MCV: 84 fL (ref 79–97)
Monocytes Absolute: 0.7 10*3/uL (ref 0.1–0.9)
Monocytes: 10 %
Neutrophils Absolute: 3.8 10*3/uL (ref 1.4–7.0)
Neutrophils: 57 %
Platelets: 184 10*3/uL (ref 150–450)
RBC: 6.23 x10E6/uL — ABNORMAL HIGH (ref 4.14–5.80)
RDW: 13.6 % (ref 11.6–15.4)
WBC: 6.7 10*3/uL (ref 3.4–10.8)

## 2020-09-16 LAB — LIPID PANEL
Chol/HDL Ratio: 4.6 ratio (ref 0.0–5.0)
Cholesterol, Total: 182 mg/dL (ref 100–199)
HDL: 40 mg/dL (ref >39)
LDL Chol Calc (NIH): 103 mg/dL — ABNORMAL HIGH (ref 0–99)
Triglycerides: 225 mg/dL — ABNORMAL HIGH (ref 0–149)
VLDL Cholesterol Cal: 39 mg/dL (ref 5–40)

## 2020-09-16 LAB — HEMOGLOBIN A1C
Est. average glucose Bld gHb Est-mCnc: 203 mg/dL
Hgb A1c MFr Bld: 8.7 % — ABNORMAL HIGH (ref 4.8–5.6)

## 2020-09-16 LAB — CARDIOVASCULAR RISK ASSESSMENT

## 2020-10-07 ENCOUNTER — Other Ambulatory Visit: Payer: Self-pay | Admitting: Nurse Practitioner

## 2020-10-07 NOTE — Telephone Encounter (Signed)
Lolita Lenz! Here's a refill request

## 2020-10-12 ENCOUNTER — Other Ambulatory Visit: Payer: Self-pay | Admitting: Physician Assistant

## 2020-12-15 ENCOUNTER — Ambulatory Visit: Payer: BLUE CROSS/BLUE SHIELD | Admitting: Physician Assistant

## 2020-12-15 ENCOUNTER — Encounter: Payer: Self-pay | Admitting: Physician Assistant

## 2020-12-15 ENCOUNTER — Other Ambulatory Visit: Payer: Self-pay

## 2020-12-15 VITALS — BP 128/84 | HR 89 | Temp 96.1°F | Ht 74.0 in | Wt >= 6400 oz

## 2020-12-15 DIAGNOSIS — E1159 Type 2 diabetes mellitus with other circulatory complications: Secondary | ICD-10-CM | POA: Diagnosis not present

## 2020-12-15 DIAGNOSIS — E1165 Type 2 diabetes mellitus with hyperglycemia: Secondary | ICD-10-CM

## 2020-12-15 DIAGNOSIS — I152 Hypertension secondary to endocrine disorders: Secondary | ICD-10-CM

## 2020-12-15 DIAGNOSIS — E782 Mixed hyperlipidemia: Secondary | ICD-10-CM | POA: Diagnosis not present

## 2020-12-15 MED ORDER — TRULICITY 3 MG/0.5ML ~~LOC~~ SOAJ
SUBCUTANEOUS | 5 refills | Status: DC
Start: 2020-12-15 — End: 2021-07-05

## 2020-12-15 NOTE — Progress Notes (Signed)
Established Patient Office Visit  Subjective:  Patient ID: William Massey, male    DOB: Mar 26, 1986  Age: 35 y.o. MRN: [id number]  CC:  Chief Complaint  Patient presents with  . diabetes    HPI William Massey presents for follow up diabetes     Patient presents with type 2 diabetes mellitus with other specified complication.  Specifically, this is type 2, non-insulin requiring diabetes, complicated by hypertension, obesity, and hyperlipidemia.  Date of diagnosis 2014.  Compliance with treatment has been fair;   He specifically denies associated symptoms, including blurred vision, fatigue, headache, hypoglycemia, nocturia, peripheral neuropathy Tobacco screen: Non-smoker.  Current meds include an oral hypoglycemic ( Jardiance 25 mg QD ),actos 66m qd  insulin/injectable ( Trulicity 3.0 mg injection once a week ),  Lotensin, and lipid lowering agent ( Lipitor ). He states his fasting glucose ranges are 120-140s fasting  and after meals low 200s    Pt presents for follow up of hypertension.  Date of diagnosis 2014.  His current cardiac medication regimen includes , a beta-blocker ( Metoprolol ER 200 mg QD ), and lotrel 10/40 and also hctz 235mqd  Compliance with treatment has been fair; he does not follow a diet and exercise regimen .  Concurrent health problems include hyperlipidemia and diabetes.   Pt presents with hyperlipidemia.  Date of diagnosis 01/24/2019.  Current treatment includes Lipitor 40.  Compliance with treatment has been good; he takes his medication as directed, maintains his low cholesterol diet, and follows up as directed.  He denies experiencing any hypercholesterolemia related symptoms.  Concurrent health problems include hypertension and diabetes.    Past Medical History:  Diagnosis Date  . Asthma   . Diabetes mellitus without complication ([facility]  . Generalized anxiety disorder   . Hypertension   . Mild intermittent asthma, uncomplicated   . Mixed hyperlipidemia    . Morbid (severe) obesity due to excess calories ([facility]  . Personal history of malignant neoplasm of testis     Past Surgical History:  Procedure Laterality Date  . ORCHIECTOMY Left 02/19/2015   Procedure: RADICAL ORCHIECTOMY;  Surgeon: PaCleon GustinMD;  Location: [facility];  Service: Urology;  Laterality: Left;    Family History  Problem Relation Age of Onset  . Hypertension Father   . Diabetes type II Father   . Leukemia Brother     Social History   Socioeconomic History  . Marital status: Single    Spouse name: Not on file  . Number of children: 1  . Years of education: Not on file  . Highest education level: Not on file  Occupational History  . Not on file  Tobacco Use  . Smoking status: Never Smoker  . Smokeless tobacco: Never Used  Vaping Use  . Vaping Use: Never used  Substance and Sexual Activity  . Alcohol use: Not on file    Comment: Drinks alcohol very infrequently. He typically consumes beer  . Drug use: Yes    Frequency: 0.2 times per week    Types: Marijuana  . Sexual activity: Not on file  Other Topics Concern  . Not on file  Social History Narrative  . Not on file   Social Determinants of Health   Financial Resource Strain: Not on file  Food Insecurity: Not on file  Transportation Needs: Not on file  Physical Activity: Not on file  Stress: Not on file  Social Connections: Not on file  Intimate Partner Violence: Not on  file     Current Outpatient Medications:  .  albuterol (VENTOLIN HFA) 108 (90 Base) MCG/ACT inhaler, Inhale 1-2 puffs into the lungs every 6 (six) hours as needed for wheezing or shortness of breath., Disp: , Rfl:  .  amLODipine (NORVASC) 10 MG tablet, Take 1 tablet (10 mg total) by mouth daily., Disp: 90 tablet, Rfl: 0 .  atorvastatin (LIPITOR) 40 MG tablet, Take 1 tablet (40 mg total) by mouth daily., Disp: 90 tablet, Rfl: 1 .  Continuous Blood Gluc Sensor (FREESTYLE LIBRE 2 SENSOR) MISC, Check sugars 3x a day before  meals. E11.65, Disp: 6 each, Rfl: 0 .  hydrochlorothiazide (HYDRODIURIL) 25 MG tablet, TAKE 1 TABLET BY MOUTH DAILY, Disp: 100 tablet, Rfl: 0 .  JARDIANCE 25 MG TABS tablet, Take 1 tablet (25 mg total) by mouth every morning., Disp: 90 tablet, Rfl: 1 .  metoprolol (TOPROL-XL) 200 MG 24 hr tablet, Take 1 tablet (200 mg total) by mouth daily., Disp: 90 tablet, Rfl: 0 .  valsartan (DIOVAN) 160 MG tablet, Take 1 tablet (160 mg total) by mouth daily., Disp: 30 tablet, Rfl: 1 .  Dulaglutide (TRULICITY) 3 BT/6.6MA SOPN, INJECT 3 MG ONCE A WEEK AS DIRECTED, Disp: 2 mL, Rfl: 5 .  pioglitazone (ACTOS) 15 MG tablet, Take 1 tablet (15 mg total) by mouth daily., Disp: 90 tablet, Rfl: 1   Allergies  Allergen Reactions  . Metformin And Related Nausea Only    ROS CONSTITUTIONAL: Negative for chills, fatigue, fever, unintentional weight gain and unintentional weight loss.  CARDIOVASCULAR: Negative for chest pain, dizziness, palpitations and pedal edema.  RESPIRATORY: Negative for recent cough and dyspnea.  GASTROINTESTINAL: Negative for abdominal pain, acid reflux symptoms, constipation, diarrhea, nausea and vomiting.  PSYCHIATRIC: Negative for sleep disturbance and to question depression screen.  Negative for depression, negative for anhedonia.                Objective:    PHYSICAL EXAM:   VS: BP 128/84 (BP Location: Left Arm, Patient Position: Sitting, Cuff Size: Large)   Pulse 89   Temp (!) 96.1 F (35.6 C) (Temporal)   Ht 6' 2"  (1.88 m)   Wt (!) 506 lb (229.5 kg)   SpO2 96%   BMI 64.97 kg/m    PHYSICAL EXAM:   VS: BP 128/84 (BP Location: Left Arm, Patient Position: Sitting, Cuff Size: Large)   Pulse 89   Temp (!) 96.1 F (35.6 C) (Temporal)   Ht 6' 2"  (1.88 m)   Wt (!) 506 lb (229.5 kg)   SpO2 96%   BMI 64.97 kg/m   GEN: Well nourished, well developed, in no acute distress - morbidly obese Cardiac: RRR; no murmurs, rubs, or gallops,no edema -  Respiratory:  normal  respiratory rate and pattern with no distress - normal breath sounds with no rales, rhonchi, wheezes or rubs Skin: warm and dry, no rash  Psych: euthymic mood, appropriate affect and demeanor    Diabetic Foot Exam - Simple   No data filed     No visits with results within 1 Day(s) from this visit.  Latest known visit with results is:  Office Visit on 09/15/2020  Component Date Value Ref Range Status  . WBC 09/15/2020 6.7  3.4 - 10.8 x10E3/uL Final  . RBC 09/15/2020 6.23* 4.14 - 5.80 x10E6/uL Final  . Hemoglobin 09/15/2020 17.6  13.0 - 17.7 g/dL Final  . Hematocrit 09/15/2020 52.4* 37.5 - 51.0 % Final  . MCV 09/15/2020 84  79 -  97 fL Final  . MCH 09/15/2020 28.3  26.6 - 33.0 pg Final  . MCHC 09/15/2020 33.6  31.5 - 35.7 g/dL Final  . RDW 09/15/2020 13.6  11.6 - 15.4 % Final  . Platelets 09/15/2020 184  150 - 450 x10E3/uL Final  . Neutrophils 09/15/2020 57  Not Estab. % Final  . Lymphs 09/15/2020 31  Not Estab. % Final  . Monocytes 09/15/2020 10  Not Estab. % Final  . Eos 09/15/2020 1  Not Estab. % Final  . Basos 09/15/2020 1  Not Estab. % Final  . Neutrophils Absolute 09/15/2020 3.8  1.4 - 7.0 x10E3/uL Final  . Lymphocytes Absolute 09/15/2020 2.1  0.7 - 3.1 x10E3/uL Final  . Monocytes Absolute 09/15/2020 0.7  0.1 - 0.9 x10E3/uL Final  . EOS (ABSOLUTE) 09/15/2020 0.1  0.0 - 0.4 x10E3/uL Final  . Basophils Absolute 09/15/2020 0.1  0.0 - 0.2 x10E3/uL Final  . Immature Granulocytes 09/15/2020 0  Not Estab. % Final  . Immature Grans (Abs) 09/15/2020 0.0  0.0 - 0.1 x10E3/uL Final  . Glucose 09/15/2020 157* 65 - 99 mg/dL Final  . BUN 09/15/2020 16  6 - 20 mg/dL Final  . Creatinine, Ser 09/15/2020 0.87  0.76 - 1.27 mg/dL Final  . GFR calc non Af Amer 09/15/2020 111  >59 mL/min/1.73 Final  . GFR calc Af Amer 09/15/2020 128  >59 mL/min/1.73 Final   Comment: **In accordance with recommendations from the NKF-ASN Task force,**   [facility] is in the process of updating its eGFR calculation  to the   2021 CKD-EPI creatinine equation that estimates kidney function   without a race variable.   . BUN/Creatinine Ratio 09/15/2020 18  9 - 20 Final  . Sodium 09/15/2020 139  134 - 144 mmol/L Final  . Potassium 09/15/2020 4.3  3.5 - 5.2 mmol/L Final  . Chloride 09/15/2020 99  96 - 106 mmol/L Final  . CO2 09/15/2020 25  20 - 29 mmol/L Final  . Calcium 09/15/2020 9.9  8.7 - 10.2 mg/dL Final  . Total Protein 09/15/2020 6.9  6.0 - 8.5 g/dL Final  . Albumin 09/15/2020 4.4  4.0 - 5.0 g/dL Final  . Globulin, Total 09/15/2020 2.5  1.5 - 4.5 g/dL Final  . Albumin/Globulin Ratio 09/15/2020 1.8  1.2 - 2.2 Final  . Bilirubin Total 09/15/2020 0.7  0.0 - 1.2 mg/dL Final  . Alkaline Phosphatase 09/15/2020 76  44 - 121 IU/L Final  . AST 09/15/2020 20  0 - 40 IU/L Final  . ALT 09/15/2020 28  0 - 44 IU/L Final  . Cholesterol, Total 09/15/2020 182  100 - 199 mg/dL Final  . Triglycerides 09/15/2020 225* 0 - 149 mg/dL Final  . HDL 09/15/2020 40  >39 mg/dL Final  . VLDL Cholesterol Cal 09/15/2020 39  5 - 40 mg/dL Final  . LDL Chol Calc (NIH) 09/15/2020 103* 0 - 99 mg/dL Final  . Chol/HDL Ratio 09/15/2020 4.6  0.0 - 5.0 ratio Final   Comment:                                   T. Chol/HDL Ratio                                             Men  Women  1/2 Avg.Risk  3.4    3.3                                   Avg.Risk  5.0    4.4                                2X Avg.Risk  9.6    7.1                                3X Avg.Risk 23.4   11.0   . Hgb A1c MFr Bld 09/15/2020 8.7* 4.8 - 5.6 % Final   Comment:          Prediabetes: 5.7 - 6.4          Diabetes: >6.4          Glycemic control for adults with diabetes: <7.0   . Est. average glucose Bld gHb Est-m* 09/15/2020 203  mg/dL Final  . Interpretation 09/15/2020 Note   Final   Supplemental report is available.    BP 128/84 (BP Location: Left Arm, Patient Position: Sitting, Cuff Size: Large)   Pulse 89   Temp (!) 96.1 F  (35.6 C) (Temporal)   Ht 6' 2"  (1.88 m)   Wt (!) 506 lb (229.5 kg)   SpO2 96%   BMI 64.97 kg/m  Wt Readings from Last 3 Encounters:  12/15/20 (!) 506 lb (229.5 kg)  09/15/20 (!) 505 lb (229.1 kg)  07/13/20 (!) 505 lb (229.1 kg)     Health Maintenance Due  Topic Date Due  . OPHTHALMOLOGY EXAM  Never done    There are no preventive care reminders to display for this patient.  Lab Results  Component Value Date   TSH 3.040 06/09/2020   Lab Results  Component Value Date   WBC 6.7 09/15/2020   HGB 17.6 09/15/2020   HCT 52.4 (H) 09/15/2020   MCV 84 09/15/2020   PLT 184 09/15/2020   Lab Results  Component Value Date   NA 139 09/15/2020   K 4.3 09/15/2020   CO2 25 09/15/2020   GLUCOSE 157 (H) 09/15/2020   BUN 16 09/15/2020   CREATININE 0.87 09/15/2020   BILITOT 0.7 09/15/2020   ALKPHOS 76 09/15/2020   AST 20 09/15/2020   ALT 28 09/15/2020   PROT 6.9 09/15/2020   ALBUMIN 4.4 09/15/2020   CALCIUM 9.9 09/15/2020   ANIONGAP 7 07/20/2015   Lab Results  Component Value Date   CHOL 182 09/15/2020   Lab Results  Component Value Date   HDL 40 09/15/2020   Lab Results  Component Value Date   LDLCALC 103 (H) 09/15/2020   Lab Results  Component Value Date   TRIG 225 (H) 09/15/2020   Lab Results  Component Value Date   CHOLHDL 4.6 09/15/2020   Lab Results  Component Value Date   HGBA1C 8.7 (H) 09/15/2020      Assessment & Plan:   Problem List Items Addressed This Visit      Cardiovascular and Mediastinum   Hypertension associated with diabetes ([place])   Relevant Medications   Dulaglutide (TRULICITY) 3 PR/9.1MB SOPN   Other Relevant Orders   CBC with Differential/Platelet   Comprehensive metabolic panel   Lipid panel     Endocrine   Type 2 diabetes  mellitus ([place]) - Primary   Relevant Medications   Dulaglutide (TRULICITY) 3 IY/2.0UW SOPN   Other Relevant Orders   CBC with Differential/Platelet   Comprehensive metabolic panel   Hemoglobin A1c      Other   Mixed hyperlipidemia   Relevant Orders   Lipid panel      Meds ordered this encounter  Medications  . Dulaglutide (TRULICITY) 3 CN/1.6ZJ SOPN    Sig: INJECT 3 MG ONCE A WEEK AS DIRECTED    Dispense:  2 mL    Refill:  5    This is for a total of 4 pens    Order Specific Question:   Supervising Provider    AnswerShelton Silvas    Follow-up: Return in about 3 months (around 03/16/2021) for chronic fasting follow up.    SARA R Graycen Sadlon, PA-C

## 2020-12-16 ENCOUNTER — Other Ambulatory Visit: Payer: Self-pay | Admitting: Physician Assistant

## 2020-12-16 DIAGNOSIS — E1165 Type 2 diabetes mellitus with hyperglycemia: Secondary | ICD-10-CM

## 2020-12-16 LAB — COMPREHENSIVE METABOLIC PANEL
ALT: 59 IU/L — ABNORMAL HIGH (ref 0–44)
AST: 34 IU/L (ref 0–40)
Albumin/Globulin Ratio: 1.8 (ref 1.2–2.2)
Albumin: 4.5 g/dL (ref 4.0–5.0)
Alkaline Phosphatase: 73 IU/L (ref 44–121)
BUN/Creatinine Ratio: 17 (ref 9–20)
BUN: 14 mg/dL (ref 6–20)
Bilirubin Total: 0.6 mg/dL (ref 0.0–1.2)
CO2: 26 mmol/L (ref 20–29)
Calcium: 9.8 mg/dL (ref 8.7–10.2)
Chloride: 97 mmol/L (ref 96–106)
Creatinine, Ser: 0.83 mg/dL (ref 0.76–1.27)
Globulin, Total: 2.5 g/dL (ref 1.5–4.5)
Glucose: 223 mg/dL — ABNORMAL HIGH (ref 65–99)
Potassium: 4.5 mmol/L (ref 3.5–5.2)
Sodium: 141 mmol/L (ref 134–144)
Total Protein: 7 g/dL (ref 6.0–8.5)
eGFR: 116 mL/min/{1.73_m2} (ref >59)

## 2020-12-16 LAB — CBC WITH DIFFERENTIAL/PLATELET
Basophils Absolute: 0 10*3/uL (ref 0.0–0.2)
Basos: 1 %
EOS (ABSOLUTE): 0.1 10*3/uL (ref 0.0–0.4)
Eos: 1 %
Hematocrit: 52.1 % — ABNORMAL HIGH (ref 37.5–51.0)
Hemoglobin: 17.7 g/dL (ref 13.0–17.7)
Immature Grans (Abs): 0 10*3/uL (ref 0.0–0.1)
Immature Granulocytes: 1 %
Lymphocytes Absolute: 2.1 10*3/uL (ref 0.7–3.1)
Lymphs: 33 %
MCH: 28.5 pg (ref 26.6–33.0)
MCHC: 34 g/dL (ref 31.5–35.7)
MCV: 84 fL (ref 79–97)
Monocytes Absolute: 0.6 10*3/uL (ref 0.1–0.9)
Monocytes: 9 %
Neutrophils Absolute: 3.7 10*3/uL (ref 1.4–7.0)
Neutrophils: 55 %
Platelets: 193 10*3/uL (ref 150–450)
RBC: 6.21 x10E6/uL — ABNORMAL HIGH (ref 4.14–5.80)
RDW: 14 % (ref 11.6–15.4)
WBC: 6.5 10*3/uL (ref 3.4–10.8)

## 2020-12-16 LAB — LIPID PANEL
Chol/HDL Ratio: 4.8 ratio (ref 0.0–5.0)
Cholesterol, Total: 191 mg/dL (ref 100–199)
HDL: 40 mg/dL (ref >39)
LDL Chol Calc (NIH): 105 mg/dL — ABNORMAL HIGH (ref 0–99)
Triglycerides: 266 mg/dL — ABNORMAL HIGH (ref 0–149)
VLDL Cholesterol Cal: 46 mg/dL — ABNORMAL HIGH (ref 5–40)

## 2020-12-16 LAB — CARDIOVASCULAR RISK ASSESSMENT

## 2020-12-16 LAB — HEMOGLOBIN A1C
Est. average glucose Bld gHb Est-mCnc: 246 mg/dL
Hgb A1c MFr Bld: 10.2 % — ABNORMAL HIGH (ref 4.8–5.6)

## 2020-12-16 MED ORDER — PIOGLITAZONE HCL 30 MG PO TABS: 30.0000 mg | ORAL_TABLET | Freq: Every day | ORAL | 2 refills | Status: DC

## 2021-02-17 ENCOUNTER — Other Ambulatory Visit: Payer: Self-pay | Admitting: Physician Assistant

## 2021-03-03 ENCOUNTER — Other Ambulatory Visit: Payer: Self-pay | Admitting: Physician Assistant

## 2021-03-03 DIAGNOSIS — E782 Mixed hyperlipidemia: Secondary | ICD-10-CM

## 2021-03-07 ENCOUNTER — Other Ambulatory Visit: Payer: Self-pay | Admitting: Physician Assistant

## 2021-03-07 DIAGNOSIS — I152 Hypertension secondary to endocrine disorders: Secondary | ICD-10-CM

## 2021-03-07 DIAGNOSIS — E1159 Type 2 diabetes mellitus with other circulatory complications: Secondary | ICD-10-CM

## 2021-03-22 ENCOUNTER — Ambulatory Visit: Payer: BLUE CROSS/BLUE SHIELD | Admitting: Physician Assistant

## 2021-03-29 ENCOUNTER — Encounter: Payer: Self-pay | Admitting: Physician Assistant

## 2021-03-29 ENCOUNTER — Ambulatory Visit: Payer: BLUE CROSS/BLUE SHIELD | Admitting: Physician Assistant

## 2021-03-29 ENCOUNTER — Other Ambulatory Visit: Payer: Self-pay

## 2021-03-29 VITALS — BP 134/66 | HR 94 | Temp 97.0°F | Ht 74.0 in | Wt >= 6400 oz

## 2021-03-29 DIAGNOSIS — E1165 Type 2 diabetes mellitus with hyperglycemia: Secondary | ICD-10-CM | POA: Diagnosis not present

## 2021-03-29 DIAGNOSIS — E782 Mixed hyperlipidemia: Secondary | ICD-10-CM

## 2021-03-29 DIAGNOSIS — I152 Hypertension secondary to endocrine disorders: Secondary | ICD-10-CM

## 2021-03-29 DIAGNOSIS — E1159 Type 2 diabetes mellitus with other circulatory complications: Secondary | ICD-10-CM

## 2021-03-29 MED ORDER — AMLODIPINE BESYLATE 10 MG PO TABS: 10.0000 mg | ORAL_TABLET | Freq: Every day | ORAL | 1 refills | Status: DC

## 2021-03-29 NOTE — Progress Notes (Signed)
Established Patient Office Visit  Subjective:  Patient ID: William Massey, male    DOB: 1986/05/02  Age: 35 y.o. MRN: [id number]  CC:  Chief Complaint  Patient presents with   diabetes    HPI William Massey presents for follow up diabetes     Patient presents with type 2 diabetes mellitus with other specified complication.  Specifically, this is type 2, non-insulin requiring diabetes, complicated by hypertension, obesity, and hyperlipidemia.  Date of diagnosis 2014.  Compliance with treatment has been fair;   He specifically denies associated symptoms, including blurred vision, fatigue, headache, hypoglycemia, nocturia, peripheral neuropathy Tobacco screen: Non-smoker.  Current meds include an oral hypoglycemic ( Jardiance 25 mg QD ),actos 39m qd  insulin/injectable ( Trulicity 3.0 mg injection once a week ),  Lotensin, and lipid lowering agent ( Lipitor ). He states his fasting glucose ranges are usually good but in afternoons has noted glucose up to 240s    Pt presents for follow up of hypertension.  Date of diagnosis 2014.  His current cardiac medication regimen includes , a beta-blocker ( Metoprolol ER 200 mg QD ), amlodopine 10  and diovan 1675mand also hctz 2573md  Compliance with treatment has been fair; he does not follow a diet and exercise regimen .  Concurrent health problems include hyperlipidemia and diabetes.   Pt presents with hyperlipidemia.  Date of diagnosis 01/24/2019.  Current treatment includes Lipitor 40.  Compliance with treatment has been good; he takes his medication as directed, maintains his low cholesterol diet, and follows up as directed.  He denies experiencing any hypercholesterolemia related symptoms.  Concurrent health problems include hypertension and diabetes.    Past Medical History:  Diagnosis Date   Asthma    Diabetes mellitus without complication ([facility]  Generalized anxiety disorder    Hypertension    Mild intermittent asthma, uncomplicated     Mixed hyperlipidemia    Morbid (severe) [place] due to excess calories ([place] [place]  Personal history of malignant neoplasm of testis     Past Surgical History:  Procedure Laterality Date   ORCHIECTOMY Left 02/19/2015   Procedure: RADICAL ORCHIECTOMY;  Surgeon: PatCleon GustinD;  Location: [facility];  Service: Urology;  Laterality: Left;    Family History  Problem Relation Age of Onset   Hypertension Father    Diabetes type II Father    Leukemia Brother     Social History   Socioeconomic History   Marital status: Single    Spouse name: Not on file   Number of children: 1   Years of education: Not on file   Highest education level: Not on file  Occupational History   Not on file  Tobacco Use   Smoking status: Never   Smokeless tobacco: Never  Vaping Use   Vaping Use: Never used  Substance and Sexual Activity   Alcohol use: Not on file    Comment: Drinks alcohol very infrequently. He typically consumes beer   Drug use: Yes    Frequency: 0.2 times per week    Types: Marijuana   Sexual activity: Not on file  Other Topics Concern   Not on file  Social History Narrative   Not on file   Social Determinants of Health   Financial Resource Strain: Not on file  Food Insecurity: Not on file  Transportation Needs: Not on file  Physical Activity: Not on file  Stress: Not on file  Social Connections: Not on file  Intimate  Partner Violence: Not on file     Current Outpatient Medications:    albuterol (VENTOLIN HFA) 108 (90 Base) MCG/ACT inhaler, Inhale 1-2 puffs into the lungs every 6 (six) hours as needed for wheezing or shortness of breath., Disp: , Rfl:    amLODipine (NORVASC) 10 MG tablet, Take 1 tablet (10 mg total) by mouth daily., Disp: 90 tablet, Rfl: 1   atorvastatin (LIPITOR) 40 MG tablet, TAKE 1 TABLET BY MOUTH DAILY., Disp: 90 tablet, Rfl: 0   Continuous Blood Gluc Sensor (FREESTYLE LIBRE 2 SENSOR) MISC, Check sugars 3x a day before meals. E11.65, Disp: 6 each,  Rfl: 0   Dulaglutide (TRULICITY) 3 XB/3.5HG SOPN, INJECT 3 MG ONCE A WEEK AS DIRECTED, Disp: 2 mL, Rfl: 5   hydrochlorothiazide (HYDRODIURIL) 25 MG tablet, TAKE 1 TABLET BY MOUTH DAILY, Disp: 100 tablet, Rfl: 0   JARDIANCE 25 MG TABS tablet, Take 1 tablet (25 mg total) by mouth every morning., Disp: 90 tablet, Rfl: 1   metoprolol (TOPROL-XL) 200 MG 24 hr tablet, TAKE 1 TABLET BY MOUTH DAILY., Disp: 90 tablet, Rfl: 0   pioglitazone (ACTOS) 30 MG tablet, Take 1 tablet (30 mg total) by mouth daily., Disp: 30 tablet, Rfl: 2   valsartan (DIOVAN) 160 MG tablet, Take 1 tablet (160 mg total) by mouth daily., Disp: 30 tablet, Rfl: 1   Allergies  Allergen Reactions   Metformin And Related Nausea Only    ROS CONSTITUTIONAL: Negative for chills, fatigue, fever, unintentional weight gain and unintentional weight loss.  E/N/T: Negative for ear pain, nasal congestion and sore throat.  CARDIOVASCULAR: Negative for chest pain, dizziness, palpitations and pedal edema.  RESPIRATORY: Negative for recent cough and dyspnea.  GASTROINTESTINAL: Negative for abdominal pain, acid reflux symptoms, constipation, diarrhea, nausea and vomiting.  MSK: Negative for arthralgias and myalgias.  INTEGUMENTARY: Negative for rash.  NEUROLOGICAL: Negative for dizziness and headaches.  PSYCHIATRIC: Negative for sleep disturbance and to question depression screen.  Negative for depression, negative for anhedonia.         Objective:  PHYSICAL EXAM:   VS: BP 134/66   Pulse 94   Temp (!) 97 F (36.1 C)   Ht 6' 2"  (1.88 m)   Wt (!) 501 lb (227.3 kg)   SpO2 96%   BMI 64.32 kg/m   GEN: Well nourished, well developed, in no acute distress  Cardiac: RRR; no murmurs, rubs, or gallops,no edema - Respiratory:  normal respiratory rate and pattern with no distress - normal breath sounds with no rales, rhonchi, wheezes or rubs GI: normal bowel sounds, no masses or tenderness Skin: warm and dry, no rash  Psych: euthymic mood,  appropriate affect and demeanor   Diabetic Foot Exam - Simple   No data filed     No visits with results within 1 Day(s) from this visit.  Latest known visit with results is:  Office Visit on 12/15/2020  Component Date Value Ref Range Status   WBC 12/15/2020 6.5  3.4 - 10.8 x10E3/uL Final   RBC 12/15/2020 6.21 (A) 4.14 - 5.80 x10E6/uL Final   Hemoglobin 12/15/2020 17.7  13.0 - 17.7 g/dL Final   Hematocrit 12/15/2020 52.1 (A) 37.5 - 51.0 % Final   MCV 12/15/2020 84  79 - 97 fL Final   MCH 12/15/2020 28.5  26.6 - 33.0 pg Final   MCHC 12/15/2020 34.0  31.5 - 35.7 g/dL Final   RDW 12/15/2020 14.0  11.6 - 15.4 % Final   Platelets 12/15/2020 193  150 -  450 x10E3/uL Final   Neutrophils 12/15/2020 55  Not Estab. % Final   Lymphs 12/15/2020 33  Not Estab. % Final   Monocytes 12/15/2020 9  Not Estab. % Final   Eos 12/15/2020 1  Not Estab. % Final   Basos 12/15/2020 1  Not Estab. % Final   Neutrophils Absolute 12/15/2020 3.7  1.4 - 7.0 x10E3/uL Final   Lymphocytes Absolute 12/15/2020 2.1  0.7 - 3.1 x10E3/uL Final   Monocytes Absolute 12/15/2020 0.6  0.1 - 0.9 x10E3/uL Final   EOS (ABSOLUTE) 12/15/2020 0.1  0.0 - 0.4 x10E3/uL Final   Basophils Absolute 12/15/2020 0.0  0.0 - 0.2 x10E3/uL Final   Immature Granulocytes 12/15/2020 1  Not Estab. % Final   Immature Grans (Abs) 12/15/2020 0.0  0.0 - 0.1 x10E3/uL Final   Glucose 12/15/2020 223 (A) 65 - 99 mg/dL Final   BUN 12/15/2020 14  6 - 20 mg/dL Final   Creatinine, Ser 12/15/2020 0.83  0.76 - 1.27 mg/dL Final   eGFR 12/15/2020 116  >59 mL/min/1.73 Final   BUN/Creatinine Ratio 12/15/2020 17  9 - 20 Final   Sodium 12/15/2020 141  134 - 144 mmol/L Final   Potassium 12/15/2020 4.5  3.5 - 5.2 mmol/L Final   Chloride 12/15/2020 97  96 - 106 mmol/L Final   CO2 12/15/2020 26  20 - 29 mmol/L Final   Calcium 12/15/2020 9.8  8.7 - 10.2 mg/dL Final   Total Protein 12/15/2020 7.0  6.0 - 8.5 g/dL Final   Albumin 12/15/2020 4.5  4.0 - 5.0 g/dL Final    Globulin, Total 12/15/2020 2.5  1.5 - 4.5 g/dL Final   Albumin/Globulin Ratio 12/15/2020 1.8  1.2 - 2.2 Final   Bilirubin Total 12/15/2020 0.6  0.0 - 1.2 mg/dL Final   Alkaline Phosphatase 12/15/2020 73  44 - 121 IU/L Final   AST 12/15/2020 34  0 - 40 IU/L Final   ALT 12/15/2020 59 (A) 0 - 44 IU/L Final   Cholesterol, Total 12/15/2020 191  100 - 199 mg/dL Final   Triglycerides 12/15/2020 266 (A) 0 - 149 mg/dL Final   HDL 12/15/2020 40  >39 mg/dL Final   VLDL Cholesterol Cal 12/15/2020 46 (A) 5 - 40 mg/dL Final   LDL Chol Calc (NIH) 12/15/2020 105 (A) 0 - 99 mg/dL Final   Chol/HDL Ratio 12/15/2020 4.8  0.0 - 5.0 ratio Final   Comment:                                   T. Chol/HDL Ratio                                             Men  Women                               1/2 Avg.Risk  3.4    3.3                                   Avg.Risk  5.0    4.4  2X Avg.Risk  9.6    7.1                                3X Avg.Risk 23.4   11.0    Hgb A1c MFr Bld 12/15/2020 10.2 (A) 4.8 - 5.6 % Final   Comment:          Prediabetes: 5.7 - 6.4          Diabetes: >6.4          Glycemic control for adults with diabetes: <7.0    Est. average glucose Bld gHb Est-m* 12/15/2020 246  mg/dL Final   Interpretation 12/15/2020 Note   Final   Supplemental report is available.    BP 134/66   Pulse 94   Temp (!) 97 F (36.1 C)   Ht 6' 2"  (1.88 m)   Wt (!) 501 lb (227.3 kg)   SpO2 96%   BMI 64.32 kg/m  Wt Readings from Last 3 Encounters:  03/29/21 (!) 501 lb (227.3 kg)  12/15/20 (!) 506 lb (229.5 kg)  09/15/20 (!) 505 lb (229.1 kg)     Health Maintenance Due  Topic Date Due   OPHTHALMOLOGY EXAM  Never done   FOOT EXAM  03/02/2021    There are no preventive care reminders to display for this patient.  Lab Results  Component Value Date   TSH 3.040 06/09/2020   Lab Results  Component Value Date   WBC 6.5 12/15/2020   HGB 17.7 12/15/2020   HCT 52.1 (H) 12/15/2020    MCV 84 12/15/2020   PLT 193 12/15/2020   Lab Results  Component Value Date   NA 141 12/15/2020   K 4.5 12/15/2020   CO2 26 12/15/2020   GLUCOSE 223 (H) 12/15/2020   BUN 14 12/15/2020   CREATININE 0.83 12/15/2020   BILITOT 0.6 12/15/2020   ALKPHOS 73 12/15/2020   AST 34 12/15/2020   ALT 59 (H) 12/15/2020   PROT 7.0 12/15/2020   ALBUMIN 4.5 12/15/2020   CALCIUM 9.8 12/15/2020   ANIONGAP 7 07/20/2015   EGFR 116 12/15/2020   Lab Results  Component Value Date   CHOL 191 12/15/2020   Lab Results  Component Value Date   HDL 40 12/15/2020   Lab Results  Component Value Date   LDLCALC 105 (H) 12/15/2020   Lab Results  Component Value Date   TRIG 266 (H) 12/15/2020   Lab Results  Component Value Date   CHOLHDL 4.8 12/15/2020   Lab Results  Component Value Date   HGBA1C 10.2 (H) 12/15/2020      Assessment & Plan:   Problem List Items Addressed This Visit       Cardiovascular and Mediastinum   Hypertension associated with diabetes ([place])   Relevant Medications   amLODipine (NORVASC) 10 MG tablet   Other Relevant Orders   CBC with Differential/Platelet   Comprehensive metabolic panel     Endocrine   Type 2 diabetes mellitus ([place]) - Primary   Relevant Orders   CBC with Differential/Platelet   Comprehensive metabolic panel   Hemoglobin A1c     Other   Mixed hyperlipidemia   Relevant Medications   amLODipine (NORVASC) 10 MG tablet   Other Relevant Orders   Lipid panel    Meds ordered this encounter  Medications   amLODipine (NORVASC) 10 MG tablet    Sig: Take 1 tablet (10 mg total) by mouth daily.  Dispense:  90 tablet    Refill:  1    Follow-up: Return in about 3 months (around 06/29/2021) for chronic fasting follow up.    SARA R Aerik Polan, PA-C

## 2021-03-30 ENCOUNTER — Other Ambulatory Visit: Payer: Self-pay | Admitting: Physician Assistant

## 2021-03-30 DIAGNOSIS — E1165 Type 2 diabetes mellitus with hyperglycemia: Secondary | ICD-10-CM

## 2021-03-30 LAB — CARDIOVASCULAR RISK ASSESSMENT

## 2021-03-30 LAB — COMPREHENSIVE METABOLIC PANEL
ALT: 20 IU/L (ref 0–44)
AST: 15 IU/L (ref 0–40)
Albumin/Globulin Ratio: 1.8 (ref 1.2–2.2)
Albumin: 4.4 g/dL (ref 4.0–5.0)
Alkaline Phosphatase: 70 IU/L (ref 44–121)
BUN/Creatinine Ratio: 14 (ref 9–20)
BUN: 12 mg/dL (ref 6–20)
Bilirubin Total: 0.6 mg/dL (ref 0.0–1.2)
CO2: 24 mmol/L (ref 20–29)
Calcium: 10 mg/dL (ref 8.7–10.2)
Chloride: 100 mmol/L (ref 96–106)
Creatinine, Ser: 0.83 mg/dL (ref 0.76–1.27)
Globulin, Total: 2.5 g/dL (ref 1.5–4.5)
Glucose: 168 mg/dL — ABNORMAL HIGH (ref 65–99)
Potassium: 4.6 mmol/L (ref 3.5–5.2)
Sodium: 141 mmol/L (ref 134–144)
Total Protein: 6.9 g/dL (ref 6.0–8.5)
eGFR: 116 mL/min/{1.73_m2} (ref >59)

## 2021-03-30 LAB — CBC WITH DIFFERENTIAL/PLATELET
Basophils Absolute: 0 10*3/uL (ref 0.0–0.2)
Basos: 1 %
EOS (ABSOLUTE): 0.1 10*3/uL (ref 0.0–0.4)
Eos: 1 %
Hematocrit: 51.3 % — ABNORMAL HIGH (ref 37.5–51.0)
Hemoglobin: 16.8 g/dL (ref 13.0–17.7)
Immature Grans (Abs): 0 10*3/uL (ref 0.0–0.1)
Immature Granulocytes: 0 %
Lymphocytes Absolute: 2 10*3/uL (ref 0.7–3.1)
Lymphs: 33 %
MCH: 27.6 pg (ref 26.6–33.0)
MCHC: 32.7 g/dL (ref 31.5–35.7)
MCV: 84 fL (ref 79–97)
Monocytes Absolute: 0.6 10*3/uL (ref 0.1–0.9)
Monocytes: 9 %
Neutrophils Absolute: 3.5 10*3/uL (ref 1.4–7.0)
Neutrophils: 56 %
Platelets: 197 10*3/uL (ref 150–450)
RBC: 6.09 x10E6/uL — ABNORMAL HIGH (ref 4.14–5.80)
RDW: 13.9 % (ref 11.6–15.4)
WBC: 6.2 10*3/uL (ref 3.4–10.8)

## 2021-03-30 LAB — LIPID PANEL
Chol/HDL Ratio: 4.4 ratio (ref 0.0–5.0)
Cholesterol, Total: 191 mg/dL (ref 100–199)
HDL: 43 mg/dL (ref >39)
LDL Chol Calc (NIH): 101 mg/dL — ABNORMAL HIGH (ref 0–99)
Triglycerides: 279 mg/dL — ABNORMAL HIGH (ref 0–149)
VLDL Cholesterol Cal: 47 mg/dL — ABNORMAL HIGH (ref 5–40)

## 2021-03-30 LAB — HEMOGLOBIN A1C
Est. average glucose Bld gHb Est-mCnc: 212 mg/dL
Hgb A1c MFr Bld: 9 % — ABNORMAL HIGH (ref 4.8–5.6)

## 2021-03-30 MED ORDER — PIOGLITAZONE HCL 45 MG PO TABS: 45.0000 mg | ORAL_TABLET | Freq: Every day | ORAL | 1 refills | Status: DC

## 2021-07-05 ENCOUNTER — Encounter: Payer: Self-pay | Admitting: Physician Assistant

## 2021-07-05 ENCOUNTER — Ambulatory Visit: Payer: BLUE CROSS/BLUE SHIELD | Admitting: Physician Assistant

## 2021-07-05 ENCOUNTER — Other Ambulatory Visit: Payer: Self-pay

## 2021-07-05 VITALS — BP 110/80 | HR 80 | Temp 97.6°F | Resp 15 | Ht 74.0 in | Wt >= 6400 oz

## 2021-07-05 DIAGNOSIS — E1159 Type 2 diabetes mellitus with other circulatory complications: Secondary | ICD-10-CM

## 2021-07-05 DIAGNOSIS — I152 Hypertension secondary to endocrine disorders: Secondary | ICD-10-CM

## 2021-07-05 DIAGNOSIS — E782 Mixed hyperlipidemia: Secondary | ICD-10-CM

## 2021-07-05 DIAGNOSIS — E1165 Type 2 diabetes mellitus with hyperglycemia: Secondary | ICD-10-CM | POA: Diagnosis not present

## 2021-07-05 MED ORDER — VALSARTAN 160 MG PO TABS: 160.0000 mg | ORAL_TABLET | Freq: Every day | ORAL | 1 refills | Status: DC

## 2021-07-05 MED ORDER — ATORVASTATIN CALCIUM 40 MG PO TABS: 40.0000 mg | ORAL_TABLET | Freq: Every day | ORAL | 0 refills | Status: DC

## 2021-07-05 MED ORDER — ATORVASTATIN CALCIUM 40 MG PO TABS: 40.0000 mg | ORAL_TABLET | Freq: Every day | ORAL | 1 refills | Status: DC

## 2021-07-05 MED ORDER — TRULICITY 1.5 MG/0.5ML ~~LOC~~ SOAJ: 1.5000 mg | SUBCUTANEOUS | 5 refills | Status: DC

## 2021-07-06 LAB — CBC WITH DIFFERENTIAL/PLATELET
Basophils Absolute: 0 10*3/uL (ref 0.0–0.2)
Basos: 1 %
EOS (ABSOLUTE): 0.1 10*3/uL (ref 0.0–0.4)
Eos: 1 %
Hematocrit: 49.3 % (ref 37.5–51.0)
Hemoglobin: 16.8 g/dL (ref 13.0–17.7)
Immature Grans (Abs): 0 10*3/uL (ref 0.0–0.1)
Immature Granulocytes: 0 %
Lymphocytes Absolute: 2 10*3/uL (ref 0.7–3.1)
Lymphs: 33 %
MCH: 28.2 pg (ref 26.6–33.0)
MCHC: 34.1 g/dL (ref 31.5–35.7)
MCV: 83 fL (ref 79–97)
Monocytes Absolute: 0.6 10*3/uL (ref 0.1–0.9)
Monocytes: 9 %
Neutrophils Absolute: 3.5 10*3/uL (ref 1.4–7.0)
Neutrophils: 56 %
Platelets: 191 10*3/uL (ref 150–450)
RBC: 5.96 x10E6/uL — ABNORMAL HIGH (ref 4.14–5.80)
RDW: 13.9 % (ref 11.6–15.4)
WBC: 6.2 10*3/uL (ref 3.4–10.8)

## 2021-07-06 LAB — COMPREHENSIVE METABOLIC PANEL
ALT: 25 IU/L (ref 0–44)
AST: 22 IU/L (ref 0–40)
Albumin/Globulin Ratio: 1.7 (ref 1.2–2.2)
Albumin: 4.5 g/dL (ref 4.0–5.0)
Alkaline Phosphatase: 71 IU/L (ref 44–121)
BUN/Creatinine Ratio: 14 (ref 9–20)
BUN: 11 mg/dL (ref 6–20)
Bilirubin Total: 0.7 mg/dL (ref 0.0–1.2)
CO2: 27 mmol/L (ref 20–29)
Calcium: 10 mg/dL (ref 8.7–10.2)
Chloride: 99 mmol/L (ref 96–106)
Creatinine, Ser: 0.78 mg/dL (ref 0.76–1.27)
Globulin, Total: 2.6 g/dL (ref 1.5–4.5)
Glucose: 167 mg/dL — ABNORMAL HIGH (ref 70–99)
Potassium: 4.1 mmol/L (ref 3.5–5.2)
Sodium: 140 mmol/L (ref 134–144)
Total Protein: 7.1 g/dL (ref 6.0–8.5)
eGFR: 118 mL/min/{1.73_m2} (ref >59)

## 2021-07-06 LAB — LIPID PANEL
Chol/HDL Ratio: 4.4 ratio (ref 0.0–5.0)
Cholesterol, Total: 203 mg/dL — ABNORMAL HIGH (ref 100–199)
HDL: 46 mg/dL (ref >39)
LDL Chol Calc (NIH): 122 mg/dL — ABNORMAL HIGH (ref 0–99)
Triglycerides: 198 mg/dL — ABNORMAL HIGH (ref 0–149)
VLDL Cholesterol Cal: 35 mg/dL (ref 5–40)

## 2021-07-06 LAB — HEMOGLOBIN A1C
Est. average glucose Bld gHb Est-mCnc: 186 mg/dL
Hgb A1c MFr Bld: 8.1 % — ABNORMAL HIGH (ref 4.8–5.6)

## 2021-07-06 LAB — CARDIOVASCULAR RISK ASSESSMENT

## 2021-07-06 NOTE — Progress Notes (Signed)
Established Patient Office Visit  Subjective:  Patient ID: William Massey, male    DOB: Jan 18, 1986  Age: 35 y.o. MRN: [id number]  CC:  Chief Complaint  Patient presents with   diabetes    HPI [name] presents for follow up diabetes     Patient presents with type 2 diabetes mellitus with other specified complication.  Specifically, this is type 2, non-insulin requiring diabetes, complicated by hypertension, obesity, and hyperlipidemia.  Date of diagnosis 2014.  Compliance with treatment has been fair;   He specifically denies associated symptoms, including blurred vision, fatigue, headache, hypoglycemia, nocturia, peripheral neuropathy Tobacco screen: Non-smoker.  Current meds include an oral hypoglycemic ( Jardiance 25 mg QD ),actos 72m qd  insulin/injectable ( Trulicity 3.0 mg injection once a week ),  Lotensin, and lipid lowering agent ( Lipitor ). He states his fasting glucose ranges are usually good but in afternoons has noted glucose up to 240s - HOWEVER PATIENT HAS BEEN OUT OF TRULICITY FOR SEVERAL WEEKS BECAUSE OF PHARMACY BACKORDER - WOULD NOT LIKE TO CHANGE MEDICATION AT THIS TIME BUT STATES THE PHARMACY CAN GET THE 1.5 - WILL WRITE FOR THAT AND HAVE HIM CONTINUE WITH DIET AND EXERCISE AS WELL - CURRENTLY LOST 15 POUNDS SINCE LAST VISIT    Pt presents for follow up of hypertension.  Date of diagnosis 2014.  His current cardiac medication regimen includes , a beta-blocker ( Metoprolol ER 200 mg QD ), amlodopine 10  and diovan 1663mand also hctz 2542md  Compliance with treatment has been fair; he HAS STARTED AN EXERCISE REGIMEN AND WATCHING DIET BETTER - Concurrent health problems include hyperlipidemia and diabetes.   Pt presents with hyperlipidemia.  Date of diagnosis 01/24/2019.  Current treatment includes Lipitor 40.  Compliance with treatment has been good; he takes his medication as directed, maintains his low cholesterol diet, and follows up as directed.  He denies  experiencing any hypercholesterolemia related symptoms.  Concurrent health problems include hypertension and diabetes.    Past Medical History:  Diagnosis Date   Asthma    [place]  Generalized anxiety disorder    Hypertension    Mild intermittent asthma, uncomplicated    Mixed hyperlipidemia    Morbid (severe) obesity due to excess calories ([facility]  Personal history of malignant neoplasm of testis     Past Surgical History:  Procedure Laterality Date   ORCHIECTOMY Left 02/19/2015   Procedure: RADICAL ORCHIECTOMY;  Surgeon: PatCleon GustinD;  Location: [facility];  Service: Urology;  Laterality: Left;    Family History  Problem Relation Age of Onset   Hypertension Father    Diabetes type II Father    Leukemia Brother     Social History   Socioeconomic History   Marital status: Single    Spouse name: Not on file   Number of children: 1   Years of education: Not on file   Highest education level: Not on file  Occupational History   Not on file  Tobacco Use   Smoking status: Never   Smokeless tobacco: Never  Vaping Use   Vaping Use: Never used  Substance and Sexual Activity   Alcohol use: Yes    Comment: Drinks alcohol very infrequently. He typically consumes beer   Drug use: Not Currently    Frequency: 0.2 times per week    Types: Marijuana   Sexual activity: Yes    Partners: Female  Other Topics Concern  Not on file  Social History Narrative   Not on file   Social Determinants of Health   Financial Resource Strain: Not on file  Food Insecurity: Not on file  Transportation Needs: Not on file  Physical Activity: Not on file  Stress: Not on file  Social Connections: Not on file  Intimate Partner Violence: Not on file     Current Outpatient Medications:    albuterol (VENTOLIN HFA) 108 (90 Base) MCG/ACT inhaler, Inhale 1-2 puffs into the lungs every 6 (six) hours as needed for wheezing or shortness of breath., Disp:  , Rfl:    amLODipine (NORVASC) 10 MG tablet, Take 1 tablet (10 mg total) by mouth daily., Disp: 90 tablet, Rfl: 1   Continuous Blood Gluc Sensor (FREESTYLE LIBRE 2 SENSOR) MISC, Check sugars 3x a day before meals. E11.65, Disp: 6 each, Rfl: 0   Dulaglutide (TRULICITY) 1.5 FI/4.3PI SOPN, Inject 1.5 mg into the skin once a week., Disp: 0.5 mL, Rfl: 5   hydrochlorothiazide (HYDRODIURIL) 25 MG tablet, TAKE 1 TABLET BY MOUTH DAILY, Disp: 100 tablet, Rfl: 0   JARDIANCE 25 MG TABS tablet, Take 1 tablet (25 mg total) by mouth every morning., Disp: 90 tablet, Rfl: 1   metoprolol (TOPROL-XL) 200 MG 24 hr tablet, TAKE 1 TABLET BY MOUTH DAILY., Disp: 90 tablet, Rfl: 0   pioglitazone (ACTOS) 45 MG tablet, Take 1 tablet (45 mg total) by mouth daily., Disp: 90 tablet, Rfl: 1   atorvastatin (LIPITOR) 40 MG tablet, Take 1 tablet (40 mg total) by mouth daily., Disp: 90 tablet, Rfl: 1   valsartan (DIOVAN) 160 MG tablet, Take 1 tablet (160 mg total) by mouth daily., Disp: 90 tablet, Rfl: 1   Allergies  Allergen Reactions   Metformin And Related Nausea Only    CONSTITUTIONAL: Negative for chills, fatigue, fever, unintentional weight gain and unintentional weight loss.  E/N/T: Negative for ear pain, nasal congestion and sore throat.  CARDIOVASCULAR: Negative for chest pain, dizziness, palpitations and pedal edema.  RESPIRATORY: Negative for recent cough and dyspnea.  GASTROINTESTINAL: Negative for abdominal pain, acid reflux symptoms, constipation, diarrhea, nausea and vomiting.  MSK: Negative for arthralgias and myalgias.  INTEGUMENTARY: Negative for rash.  NEUROLOGICAL: Negative for dizziness and headaches.  PSYCHIATRIC: Negative for sleep disturbance and to question depression screen.  Negative for depression, negative for anhedonia.      Objective:  PHYSICAL EXAM:   VS: BP 110/80   Pulse 80   Temp 97.6 F (36.4 C)   Resp 15   Ht 6' 2" (1.88 m)   Wt (!) 486 lb (220.4 kg)   SpO2 93%   BMI 62.40  kg/m   GEN: Well nourished, well developed, in no acute distress - morbidly obese Cardiac: RRR; no murmurs, rubs, or gallops,no edema - no significant varicosities Respiratory:  normal respiratory rate and pattern with no distress - normal breath sounds with no rales, rhonchi, wheezes or rubs Skin: warm and dry, no rash  Psych: euthymic mood, appropriate affect and demeanor  Diabetic Foot Exam - Simple   No data filed     Office Visit on 07/05/2021  Component Date Value Ref Range Status   WBC 07/05/2021 6.2  3.4 - 10.8 x10E3/uL Final   RBC 07/05/2021 5.96 (A)  4.14 - 5.80 x10E6/uL Final   Hemoglobin 07/05/2021 16.8  13.0 - 17.7 g/dL Final   Hematocrit 07/05/2021 49.3  37.5 - 51.0 % Final   MCV 07/05/2021 83  79 - 97 fL Final  MCH 07/05/2021 28.2  26.6 - 33.0 pg Final   MCHC 07/05/2021 34.1  31.5 - 35.7 g/dL Final   RDW 07/05/2021 13.9  11.6 - 15.4 % Final   Platelets 07/05/2021 191  150 - 450 x10E3/uL Final   Neutrophils 07/05/2021 56  Not Estab. % Final   Lymphs 07/05/2021 33  Not Estab. % Final   Monocytes 07/05/2021 9  Not Estab. % Final   Eos 07/05/2021 1  Not Estab. % Final   Basos 07/05/2021 1  Not Estab. % Final   Neutrophils Absolute 07/05/2021 3.5  1.4 - 7.0 x10E3/uL Final   Lymphocytes Absolute 07/05/2021 2.0  0.7 - 3.1 x10E3/uL Final   Monocytes Absolute 07/05/2021 0.6  0.1 - 0.9 x10E3/uL Final   EOS (ABSOLUTE) 07/05/2021 0.1  0.0 - 0.4 x10E3/uL Final   Basophils Absolute 07/05/2021 0.0  0.0 - 0.2 x10E3/uL Final   Immature Granulocytes 07/05/2021 0  Not Estab. % Final   Immature Grans (Abs) 07/05/2021 0.0  0.0 - 0.1 x10E3/uL Final   Glucose 07/05/2021 167 (A)  70 - 99 mg/dL Final   BUN 07/05/2021 11  6 - 20 mg/dL Final   Creatinine, Ser 07/05/2021 0.78  0.76 - 1.27 mg/dL Final   eGFR 07/05/2021 118  >59 mL/min/1.73 Final   BUN/Creatinine Ratio 07/05/2021 14  9 - 20 Final   Sodium 07/05/2021 140  134 - 144 mmol/L Final   Potassium 07/05/2021 4.1  3.5 - 5.2 mmol/L  Final   Chloride 07/05/2021 99  96 - 106 mmol/L Final   CO2 07/05/2021 27  20 - 29 mmol/L Final   Calcium 07/05/2021 10.0  8.7 - 10.2 mg/dL Final   Total Protein 07/05/2021 7.1  6.0 - 8.5 g/dL Final   Albumin 07/05/2021 4.5  4.0 - 5.0 g/dL Final   Globulin, Total 07/05/2021 2.6  1.5 - 4.5 g/dL Final   Albumin/Globulin Ratio 07/05/2021 1.7  1.2 - 2.2 Final   Bilirubin Total 07/05/2021 0.7  0.0 - 1.2 mg/dL Final   Alkaline Phosphatase 07/05/2021 71  44 - 121 IU/L Final   AST 07/05/2021 22  0 - 40 IU/L Final   ALT 07/05/2021 25  0 - 44 IU/L Final   Cholesterol, Total 07/05/2021 203 (A)  100 - 199 mg/dL Final   Triglycerides 07/05/2021 198 (A)  0 - 149 mg/dL Final   HDL 07/05/2021 46  >39 mg/dL Final   VLDL Cholesterol Cal 07/05/2021 35  5 - 40 mg/dL Final   LDL Chol Calc (NIH) 07/05/2021 122 (A)  0 - 99 mg/dL Final   Chol/HDL Ratio 07/05/2021 4.4  0.0 - 5.0 ratio Final   Comment:                                   T. Chol/HDL Ratio                                             Men  Women                               1/2 Avg.Risk  3.4    3.3  Avg.Risk  5.0    4.4                                2X Avg.Risk  9.6    7.1                                3X Avg.Risk 23.4   11.0    Hgb A1c MFr Bld 07/05/2021 8.1 (A)  4.8 - 5.6 % Final   Comment:          Prediabetes: 5.7 - 6.4          Diabetes: >6.4          Glycemic control for adults with diabetes: <7.0    Est. average glucose Bld gHb Est-m* 07/05/2021 186  mg/dL Final   Interpretation 07/05/2021 Note   Final   Supplemental report is available.    BP 110/80   Pulse 80   Temp 97.6 F (36.4 C)   Resp 15   Ht 6' 2" (1.88 m)   Wt (!) 486 lb (220.4 kg)   SpO2 93%   BMI 62.40 kg/m  Wt Readings from Last 3 Encounters:  07/05/21 (!) 486 lb (220.4 kg)  03/29/21 (!) 501 lb (227.3 kg)  12/15/20 (!) 506 lb (229.5 kg)     Health Maintenance Due  Topic Date Due   OPHTHALMOLOGY EXAM  Never done    Pneumococcal Vaccine 15-22 Years old (2 - PCV) 07/18/2016   FOOT EXAM  03/02/2021    There are no preventive care reminders to display for this patient.  Lab Results  Component Value Date   TSH 3.040 06/09/2020   Lab Results  Component Value Date   WBC 6.2 07/05/2021   HGB 16.8 07/05/2021   HCT 49.3 07/05/2021   MCV 83 07/05/2021   PLT 191 07/05/2021   Lab Results  Component Value Date   NA 140 07/05/2021   K 4.1 07/05/2021   CO2 27 07/05/2021   GLUCOSE 167 (H) 07/05/2021   BUN 11 07/05/2021   CREATININE 0.78 07/05/2021   BILITOT 0.7 07/05/2021   ALKPHOS 71 07/05/2021   AST 22 07/05/2021   ALT 25 07/05/2021   PROT 7.1 07/05/2021   ALBUMIN 4.5 07/05/2021   CALCIUM 10.0 07/05/2021   ANIONGAP 7 07/20/2015   EGFR 118 07/05/2021   Lab Results  Component Value Date   CHOL 203 (H) 07/05/2021   Lab Results  Component Value Date   HDL 46 07/05/2021   Lab Results  Component Value Date   LDLCALC 122 (H) 07/05/2021   Lab Results  Component Value Date   TRIG 198 (H) 07/05/2021   Lab Results  Component Value Date   CHOLHDL 4.4 07/05/2021   Lab Results  Component Value Date   HGBA1C 8.1 (H) 07/05/2021      Assessment & Plan:   Problem List Items Addressed This Visit       Cardiovascular and Mediastinum   Hypertension associated with diabetes ([place]) - Primary   Relevant Medications   Dulaglutide (TRULICITY) 1.5 KD/3.2IZ SOPN   valsartan (DIOVAN) 160 MG tablet   atorvastatin (LIPITOR) 40 MG tablet   Other Relevant Orders   CBC with Differential/Platelet (Completed)   Comprehensive metabolic panel (Completed)     Endocrine   Type 2 diabetes mellitus (HCC)   Relevant Medications   Dulaglutide (TRULICITY) 1.5  MG/0.5ML SOPN   valsartan (DIOVAN) 160 MG tablet   atorvastatin (LIPITOR) 40 MG tablet   Other Relevant Orders   CBC with Differential/Platelet (Completed)   Comprehensive metabolic panel (Completed)   Hemoglobin A1c (Completed)     Other    Mixed hyperlipidemia   Relevant Medications   valsartan (DIOVAN) 160 MG tablet   atorvastatin (LIPITOR) 40 MG tablet   Other Relevant Orders   Lipid panel (Completed)    Meds ordered this encounter  Medications   Dulaglutide (TRULICITY) 1.5 GE/9.5MW SOPN    Sig: Inject 1.5 mg into the skin once a week.    Dispense:  0.5 mL    Refill:  5    Order Specific Question:   Supervising Provider    Answer:   Shelton Silvas   DISCONTD: valsartan (DIOVAN) 160 MG tablet    Sig: Take 1 tablet (160 mg total) by mouth daily.    Dispense:  30 tablet    Refill:  1    Order Specific Question:   Supervising Provider    Answer:   Shelton Silvas   DISCONTD: atorvastatin (LIPITOR) 40 MG tablet    Sig: Take 1 tablet (40 mg total) by mouth daily.    Dispense:  90 tablet    Refill:  0    Order Specific Question:   Supervising Provider    Answer:   Shelton Silvas   valsartan (DIOVAN) 160 MG tablet    Sig: Take 1 tablet (160 mg total) by mouth daily.    Dispense:  90 tablet    Refill:  1    Order Specific Question:   Supervising Provider    Answer:   Shelton Silvas   atorvastatin (LIPITOR) 40 MG tablet    Sig: Take 1 tablet (40 mg total) by mouth daily.    Dispense:  90 tablet    Refill:  1    Order Specific Question:   Supervising Provider    AnswerShelton Silvas    Follow-up: Return if symptoms worsen or fail to improve.    SARA R DAVIS, PA-C

## 2021-07-06 NOTE — Progress Notes (Addendum)
Blood count normal.  Liver function normal.  Kidney function normal.  Cholesterol: LDL 122 worsened, trigs improved from 279 to 198.  Recommend vascepa 1 gm 2 capsules twice a day.  Recommend increase lipitor 80 mg once daily.  HBA1C: 8.1. Improved, but not at goal  Recommend increase trulicity to 3 mg weekly.    Pt has not been able to get trulicity 3mg  and has been out for several weeks - he likes this medication and tolerates well - wanted to try the 1.5mg  in addition to now working out and losing weight

## 2021-07-07 ENCOUNTER — Other Ambulatory Visit: Payer: Self-pay | Admitting: Physician Assistant

## 2021-07-07 MED ORDER — ICOSAPENT ETHYL 1 G PO CAPS: 2.0000 g | ORAL_CAPSULE | Freq: Two times a day (BID) | ORAL | 3 refills | Status: DC

## 2021-07-07 MED ORDER — ATORVASTATIN CALCIUM 80 MG PO TABS: 80.0000 mg | ORAL_TABLET | Freq: Every day | ORAL | 1 refills | Status: DC

## 2021-07-19 ENCOUNTER — Other Ambulatory Visit: Payer: Self-pay | Admitting: Physician Assistant

## 2021-07-19 DIAGNOSIS — E1165 Type 2 diabetes mellitus with hyperglycemia: Secondary | ICD-10-CM

## 2021-07-21 ENCOUNTER — Ambulatory Visit: Payer: BC Managed Care – PPO | Admitting: Nurse Practitioner

## 2021-07-21 ENCOUNTER — Encounter: Payer: Self-pay | Admitting: Nurse Practitioner

## 2021-07-21 VITALS — BP 130/80 | HR 98 | Temp 97.9°F | Resp 16 | Ht 74.0 in | Wt >= 6400 oz

## 2021-07-21 DIAGNOSIS — R0981 Nasal congestion: Secondary | ICD-10-CM | POA: Diagnosis not present

## 2021-07-21 DIAGNOSIS — J4520 Mild intermittent asthma, uncomplicated: Secondary | ICD-10-CM | POA: Diagnosis not present

## 2021-07-21 DIAGNOSIS — J018 Other acute sinusitis: Secondary | ICD-10-CM

## 2021-07-21 DIAGNOSIS — R051 Acute cough: Secondary | ICD-10-CM

## 2021-07-21 MED ORDER — AZITHROMYCIN 250 MG PO TABS
ORAL_TABLET | ORAL | 0 refills | Status: AC
Start: 2021-07-21 — End: 2021-07-26

## 2021-07-21 MED ORDER — FLUTICASONE PROPIONATE 50 MCG/ACT NA SUSP: 2.0000 | Freq: Every day | NASAL | 6 refills | Status: AC

## 2021-07-21 MED ORDER — PROMETHAZINE-DM 6.25-15 MG/5ML PO SYRP: 5.0000 mL | ORAL_SOLUTION | Freq: Four times a day (QID) | ORAL | 0 refills | Status: DC | PRN

## 2021-07-21 NOTE — Patient Instructions (Signed)
Take Z-pack as directed Take Flonase nasal spray daily Take cough syrup up to 4 times as needed Rest and push fluids Follow-up as needed   Sinusitis, Adult Sinusitis is soreness and swelling (inflammation) of your sinuses. Sinuses are hollow spaces in the bones around your face. They are located: Around your eyes. In the middle of your forehead. Behind your nose. In your cheekbones. Your sinuses and nasal passages are lined with a fluid called mucus. Mucus drains out of your sinuses. Swelling can trap mucus in your sinuses. This lets germs (bacteria, virus, or fungus) grow, which leads to infection. Most of the time, this condition is caused by a virus. What are the causes? This condition is caused by: Allergies. Asthma. Germs. Things that block your nose or sinuses. Growths in the nose (nasal polyps). Chemicals or irritants in the air. Fungus (rare). What increases the risk? You are more likely to develop this condition if: You have a weak body defense system (immune system). You do a lot of swimming or diving. You use nasal sprays too much. You smoke. What are the signs or symptoms? The main symptoms of this condition are pain and a feeling of pressure around the sinuses. Other symptoms include: Stuffy nose (congestion). Runny nose (drainage). Swelling and warmth in the sinuses. Headache. Toothache. A cough that may get worse at night. Mucus that collects in the throat or the back of the nose (postnasal drip). Being unable to smell and taste. Being very tired (fatigue). A fever. Sore throat. Bad breath. How is this diagnosed? This condition is diagnosed based on: Your symptoms. Your medical history. A physical exam. Tests to find out if your condition is short-term (acute) or long-term (chronic). Your doctor may: Check your nose for growths (polyps). Check your sinuses using a tool that has a light (endoscope). Check for allergies or germs. Do imaging tests, such  as an MRI or CT scan. How is this treated? Treatment for this condition depends on the cause and whether it is short-term or long-term. If caused by a virus, your symptoms should go away on their own within 10 days. You may be given medicines to relieve symptoms. They include: Medicines that shrink swollen tissue in the nose. Medicines that treat allergies (antihistamines). A spray that treats swelling of the nostrils.  Rinses that help get rid of thick mucus in your nose (nasal saline washes). If caused by bacteria, your doctor may wait to see if you will get better without treatment. You may be given antibiotic medicine if you have: A very bad infection. A weak body defense system. If caused by growths in the nose, you may need to have surgery. Follow these instructions at home: Medicines Take, use, or apply over-the-counter and prescription medicines only as told by your doctor. These may include nasal sprays. If you were prescribed an antibiotic medicine, take it as told by your doctor. Do not stop taking the antibiotic even if you start to feel better. Hydrate and humidify  Drink enough water to keep your pee (urine) pale yellow. Use a cool mist humidifier to keep the humidity level in your home above 50%. Breathe in steam for 10-15 minutes, 3-4 times a day, or as told by your doctor. You can do this in the bathroom while a hot shower is running. Try not to spend time in cool or dry air. Rest Rest as much as you can. Sleep with your head raised (elevated). Make sure you get enough sleep each night. General  instructions  Put a warm, moist washcloth on your face 3-4 times a day, or as often as told by your doctor. This will help with discomfort. Wash your hands often with soap and water. If there is no soap and water, use hand sanitizer. Do not smoke. Avoid being around people who are smoking (secondhand smoke). Keep all follow-up visits as told by your doctor. This is  important. Contact a doctor if: You have a fever. Your symptoms get worse. Your symptoms do not get better within 10 days. Get help right away if: You have a very bad headache. You cannot stop throwing up (vomiting). You have very bad pain or swelling around your face or eyes. You have trouble seeing. You feel confused. Your neck is stiff. You have trouble breathing. Summary Sinusitis is swelling of your sinuses. Sinuses are hollow spaces in the bones around your face. This condition is caused by tissues in your nose that become inflamed or swollen. This traps germs. These can lead to infection. If you were prescribed an antibiotic medicine, take it as told by your doctor. Do not stop taking it even if you start to feel better. Keep all follow-up visits as told by your doctor. This is important. This information is not intended to replace advice given to you by your health care provider. Make sure you discuss any questions you have with your health care provider. Document Revised: 01/07/2018 Document Reviewed: 01/07/2018 Elsevier Patient Education  2022 [employer].

## 2021-07-21 NOTE — Progress Notes (Signed)
Acute Office Visit  Subjective:    Patient ID: William Massey, male    DOB: 1985/08/26, 35 y.o.   MRN: [id number]  Chief Complaint  Patient presents with   Cough    HPI: Kip is a 35 year old Caucasian male that presents with sinus congestion, post-nasal-drip, non-productive cough, and fatigue. Onset of symptoms was one week ago. Treatment has included Mucinex, Nyquil, and cough medication, and inhaler. States several of his co-workers have been ill with similar symptoms. He has a history of asthma and type 2 diabetes mellitus.      Past Medical History:  Diagnosis Date   Asthma    Diabetes mellitus without complication ([place])    Generalized anxiety disorder    Hypertension    Mild intermittent asthma, uncomplicated    Mixed hyperlipidemia    Morbid (severe) obesity due to excess calories ([place])    Personal history of malignant neoplasm of testis     Past Surgical History:  Procedure Laterality Date   ORCHIECTOMY Left 02/19/2015   Procedure: RADICAL ORCHIECTOMY;  Surgeon: Cleon Gustin, MD;  Location: [facility];  Service: Urology;  Laterality: Left;    Family History  Problem Relation Age of Onset   Hypertension Father    Diabetes type II Father    Leukemia Brother     Social History   Socioeconomic History   Marital status: Single    Spouse name: Not on file   Number of children: 1   Years of education: Not on file   Highest education level: Not on file  Occupational History   Not on file  Tobacco Use   Smoking status: Never   Smokeless tobacco: Never  Vaping Use   Vaping Use: Never used  Substance and Sexual Activity   Alcohol use: Yes    Comment: Drinks alcohol very infrequently. He typically consumes beer   Drug use: Not Currently    Frequency: 0.2 times per week    Types: Marijuana   Sexual activity: Yes    Partners: Female  Other Topics Concern   Not on file  Social History Narrative   Not on file   Social Determinants of Health    Financial Resource Strain: Not on file  Food Insecurity: Not on file  Transportation Needs: Not on file  Physical Activity: Not on file  Stress: Not on file  Social Connections: Not on file  Intimate Partner Violence: Not on file    Outpatient Medications Prior to Visit  Medication Sig Dispense Refill   albuterol (VENTOLIN HFA) 108 (90 Base) MCG/ACT inhaler Inhale 1-2 puffs into the lungs every 6 (six) hours as needed for wheezing or shortness of breath.     amLODipine (NORVASC) 10 MG tablet Take 1 tablet (10 mg total) by mouth daily. 90 tablet 1   atorvastatin (LIPITOR) 80 MG tablet Take 1 tablet (80 mg total) by mouth daily. 90 tablet 1   Continuous Blood Gluc Sensor (FREESTYLE LIBRE 2 SENSOR) MISC Check sugars 3x a day before meals. E11.65 6 each 0   Dulaglutide (TRULICITY) 1.5 XN/2.3FT SOPN Inject 1.5 mg into the skin once a week. 0.5 mL 5   hydrochlorothiazide (HYDRODIURIL) 25 MG tablet TAKE 1 TABLET BY MOUTH DAILY 100 tablet 0   icosapent Ethyl (VASCEPA) 1 g capsule Take 2 capsules (2 g total) by mouth 2 (two) times daily. 120 capsule 3   JARDIANCE 25 MG TABS tablet TAKE 1 TABLET BY MOUTH EVERY MORNING. 90 tablet 0   metoprolol (TOPROL-XL)  200 MG 24 hr tablet TAKE 1 TABLET BY MOUTH DAILY. 90 tablet 0   pioglitazone (ACTOS) 45 MG tablet Take 1 tablet (45 mg total) by mouth daily. 90 tablet 1   valsartan (DIOVAN) 160 MG tablet Take 1 tablet (160 mg total) by mouth daily. 90 tablet 1   No facility-administered medications prior to visit.    Allergies  Allergen Reactions   Metformin And Related Nausea Only    Review of Systems  Constitutional:  Positive for fatigue. Negative for chills and fever.  HENT:  Positive for congestion, postnasal drip, rhinorrhea, sinus pressure, sinus pain, sneezing and sore throat. Negative for ear pain.   Eyes: Negative.   Respiratory:  Positive for cough, chest tightness and shortness of breath.   Cardiovascular:  Negative for chest pain.   Gastrointestinal:  Negative for constipation, diarrhea, nausea and vomiting.  Endocrine: Negative.   Genitourinary: Negative.   Musculoskeletal:  Positive for arthralgias and myalgias.       Generalized bodyaches  Skin: Negative.   Allergic/Immunologic: Positive for environmental allergies.  Neurological:  Positive for headaches.  Hematological: Negative.   Psychiatric/Behavioral: Negative.        Objective:    Physical Exam Constitutional:      Appearance: He is ill-appearing.  HENT:     Nose: Congestion and rhinorrhea present.     Mouth/Throat:     Pharynx: Posterior oropharyngeal erythema present.  Cardiovascular:     Rate and Rhythm: Normal rate and regular rhythm.     Pulses: Normal pulses.     Heart sounds: Normal heart sounds.  Pulmonary:     Effort: Pulmonary effort is normal.     Breath sounds: Normal breath sounds.  Abdominal:     General: Bowel sounds are normal.     Palpations: Abdomen is soft.  Musculoskeletal:     Cervical back: Neck supple.  Skin:    General: Skin is warm and dry.     Capillary Refill: Capillary refill takes less than 2 seconds.  Neurological:     General: No focal deficit present.     Mental Status: He is alert and oriented to person, place, and time.  Psychiatric:        Mood and Affect: Mood normal.        Behavior: Behavior normal.    BP 130/80   Pulse 98   Temp 97.9 F (36.6 C)   Resp 16   Ht 6' 2"  (1.88 m)   Wt (!) 493 lb (223.6 kg)   SpO2 96%   BMI 63.30 kg/m  Wt Readings from Last 3 Encounters:  07/21/21 (!) 493 lb (223.6 kg)  07/05/21 (!) 486 lb (220.4 kg)  03/29/21 (!) 501 lb (227.3 kg)    Health Maintenance Due  Topic Date Due   OPHTHALMOLOGY EXAM  Never done   Pneumococcal Vaccine 45-75 Years old (2 - PCV) 07/18/2016   FOOT EXAM  03/02/2021       Lab Results  Component Value Date   TSH 3.040 06/09/2020   Lab Results  Component Value Date   WBC 6.2 07/05/2021   HGB 16.8 07/05/2021   HCT 49.3  07/05/2021   MCV 83 07/05/2021   PLT 191 07/05/2021   Lab Results  Component Value Date   NA 140 07/05/2021   K 4.1 07/05/2021   CO2 27 07/05/2021   GLUCOSE 167 (H) 07/05/2021   BUN 11 07/05/2021   CREATININE 0.78 07/05/2021   BILITOT 0.7 07/05/2021  ALKPHOS 71 07/05/2021   AST 22 07/05/2021   ALT 25 07/05/2021   PROT 7.1 07/05/2021   ALBUMIN 4.5 07/05/2021   CALCIUM 10.0 07/05/2021   ANIONGAP 7 07/20/2015   EGFR 118 07/05/2021   Lab Results  Component Value Date   CHOL 203 (H) 07/05/2021   Lab Results  Component Value Date   HDL 46 07/05/2021   Lab Results  Component Value Date   LDLCALC 122 (H) 07/05/2021   Lab Results  Component Value Date   TRIG 198 (H) 07/05/2021   Lab Results  Component Value Date   CHOLHDL 4.4 07/05/2021   Lab Results  Component Value Date   HGBA1C 8.1 (H) 07/05/2021       Assessment & Plan:   1. Acute non-recurrent sinusitis of other sinus - azithromycin (ZITHROMAX) 250 MG tablet; Take 2 tablets on day 1, then 1 tablet daily on days 2 through 5  Dispense: 6 tablet; Refill: 0 - promethazine-dextromethorphan (PROMETHAZINE-DM) 6.25-15 MG/5ML syrup; Take 5 mLs by mouth 4 (four) times daily as needed.  Dispense: 118 mL; Refill: 0  2. Sinus congestion - POC COVID-19-NEGATIVE - Influenza A/B-NEGATIVE - fluticasone (FLONASE) 50 MCG/ACT nasal spray; Place 2 sprays into both nostrils daily.  Dispense: 16 g; Refill: 6  3. Acute cough - POC COVID-19-NEGATIVE - Influenza A/B-NEGATIVE - promethazine-dextromethorphan (PROMETHAZINE-DM) 6.25-15 MG/5ML syrup; Take 5 mLs by mouth 4 (four) times daily as needed.  Dispense: 118 mL; Refill: 0  4. Mild intermittent asthma without complication  -Continue albuterol as needed -Seek emergency medical care if symptoms suddenly become severe  Take Z-pack as directed Take Flonase nasal spray daily Take cough syrup up to 4 times as needed Rest and push fluids Follow-up as needed    Follow-up:  PRN  An After Visit Summary was printed and given to the patient.  I, Rip Harbour, NP, have reviewed all documentation for this visit. The documentation on 07/21/21 for the exam, diagnosis, procedures, and orders are all accurate and complete.    Signed,  Rip Harbour, NP [place] [phone]

## 2021-07-24 LAB — POCT INFLUENZA A/B
Influenza A, POC: NEGATIVE
Influenza B, POC: NEGATIVE

## 2021-07-24 LAB — POC COVID19 BINAXNOW: SARS Coronavirus 2 Ag: NEGATIVE

## 2021-08-24 ENCOUNTER — Other Ambulatory Visit: Payer: Self-pay | Admitting: Physician Assistant

## 2021-08-24 DIAGNOSIS — I152 Hypertension secondary to endocrine disorders: Secondary | ICD-10-CM

## 2021-08-24 DIAGNOSIS — E1159 Type 2 diabetes mellitus with other circulatory complications: Secondary | ICD-10-CM

## 2021-09-27 ENCOUNTER — Telehealth: Payer: Self-pay

## 2021-09-27 NOTE — Telephone Encounter (Signed)
PRIOR AUTH WAS DONE THROUGH PATIENT'S INSURANCE FOR TRULICITY 3MG  AND WAS APPROVED FOR 1 YEAR.

## 2021-10-10 ENCOUNTER — Ambulatory Visit: Payer: BC Managed Care – PPO | Admitting: Physician Assistant

## 2021-10-10 ENCOUNTER — Encounter: Payer: Self-pay | Admitting: Physician Assistant

## 2021-10-10 ENCOUNTER — Other Ambulatory Visit: Payer: Self-pay

## 2021-10-10 VITALS — BP 138/74 | HR 98 | Temp 95.6°F | Ht 74.0 in | Wt >= 6400 oz

## 2021-10-10 DIAGNOSIS — I152 Hypertension secondary to endocrine disorders: Secondary | ICD-10-CM | POA: Diagnosis not present

## 2021-10-10 DIAGNOSIS — E782 Mixed hyperlipidemia: Secondary | ICD-10-CM | POA: Diagnosis not present

## 2021-10-10 DIAGNOSIS — E1159 Type 2 diabetes mellitus with other circulatory complications: Secondary | ICD-10-CM | POA: Diagnosis not present

## 2021-10-10 DIAGNOSIS — E1165 Type 2 diabetes mellitus with hyperglycemia: Secondary | ICD-10-CM | POA: Diagnosis not present

## 2021-10-10 MED ORDER — TRULICITY 3 MG/0.5ML ~~LOC~~ SOAJ: 3.0000 mg | SUBCUTANEOUS | 5 refills | Status: DC

## 2021-10-10 NOTE — Progress Notes (Signed)
Established Patient Office Visit  Subjective:  Patient ID: William Massey, male    DOB: 1986/01/15  Age: 36 y.o. MRN: [id number]  CC:  Chief Complaint  Patient presents with   diabetes    HPI Khalon Cansler presents for follow up diabetes     Patient presents with type 2 diabetes mellitus with other specified complication.  Specifically, this is type 2, non-insulin requiring diabetes, complicated by hypertension, obesity, and hyperlipidemia.  Date of diagnosis 2014.  Compliance with treatment has been fair;   He specifically denies associated symptoms, including blurred vision, fatigue, headache, hypoglycemia, nocturia, peripheral neuropathy Tobacco screen: Non-smoker.  Current meds include an oral hypoglycemic ( Jardiance 25 mg QD ),actos 78m qd  insulin/injectable ( Trulicity 3.0 mg injection once a week ),  Lotensin, and lipid lowering agent ( Lipitor ). Pt states glucose ranges 84-100 during day but fasting at about 200 - he has had issues getting trulicity and is supposed to be on 353mhowever has had issues with su;;ies so was taking 1.5 and missed last week --- We do not have samples today but will call drug rep for sample and also for help finding this med at another pharmacy    Pt presents for follow up of hypertension.  Date of diagnosis 2014.  His current cardiac medication regimen includes , a beta-blocker ( Metoprolol ER 200 mg QD ), amlodopine 10  and diovan 16031mnd also hctz 7m43m  Compliance with treatment has been fair; he HAS STARTED AN EXERCISE REGIMEN AND WATCHING DIET BETTER - Concurrent health problems include hyperlipidemia and diabetes.   Pt presents with hyperlipidemia.  Date of diagnosis 01/24/2019.  Current treatment includes Lipitor 40 and vascepa  Compliance with treatment has been good; he takes his medication as directed, maintains his low cholesterol diet, and follows up as directed.  He denies experiencing any hypercholesterolemia related symptoms.   Concurrent health problems include hypertension and diabetes.    Past Medical History:  Diagnosis Date   Asthma    Diabetes mellitus without complication (HCC)[place] Generalized anxiety disorder    Hypertension    Mild intermittent asthma, uncomplicated    Mixed hyperlipidemia    Morbid (severe) obesity due to excess calories (HCC)[place] Personal history of malignant neoplasm of testis     Past Surgical History:  Procedure Laterality Date   ORCHIECTOMY Left 02/19/2015   Procedure: RADICAL ORCHIECTOMY;  Surgeon: PatrCleon Gustin;  Location: [facility];  Service: Urology;  Laterality: Left;    Family History  Problem Relation Age of Onset   Hypertension Father    Diabetes type II Father    Leukemia Brother     Social History   Socioeconomic History   Marital status: Single    Spouse name: Not on file   Number of children: 1   Years of education: Not on file   Highest education level: Not on file  Occupational History   Not on file  Tobacco Use   Smoking status: Never   Smokeless tobacco: Never  Vaping Use   Vaping Use: Never used  Substance and Sexual Activity   Alcohol use: Yes    Comment: Drinks alcohol very infrequently. He typically consumes beer   Drug use: Not Currently    Frequency: 0.2 times per week    Types: Marijuana   Sexual activity: Yes    Partners: Female  Other Topics Concern   Not on file  Social History Narrative  Not on file   Social Determinants of Health   Financial Resource Strain: Not on file  Food Insecurity: Not on file  Transportation Needs: Not on file  Physical Activity: Not on file  Stress: Not on file  Social Connections: Not on file  Intimate Partner Violence: Not on file     Current Outpatient Medications:    Dulaglutide (TRULICITY) 3 NO/0.3BC SOPN, Inject 3 mg as directed once a week., Disp: 0.5 mL, Rfl: 5   albuterol (VENTOLIN HFA) 108 (90 Base) MCG/ACT inhaler, Inhale 1-2 puffs into the lungs every 6 (six) hours as  needed for wheezing or shortness of breath., Disp: , Rfl:    amLODipine (NORVASC) 10 MG tablet, Take 1 tablet (10 mg total) by mouth daily., Disp: 90 tablet, Rfl: 1   atorvastatin (LIPITOR) 80 MG tablet, Take 1 tablet (80 mg total) by mouth daily., Disp: 90 tablet, Rfl: 1   Continuous Blood Gluc Sensor (FREESTYLE LIBRE 2 SENSOR) MISC, Check sugars 3x a day before meals. E11.65, Disp: 6 each, Rfl: 0   fluticasone (FLONASE) 50 MCG/ACT nasal spray, Place 2 sprays into both nostrils daily., Disp: 16 g, Rfl: 6   hydrochlorothiazide (HYDRODIURIL) 25 MG tablet, TAKE 1 TABLET BY MOUTH DAILY, Disp: 100 tablet, Rfl: 0   icosapent Ethyl (VASCEPA) 1 g capsule, Take 2 capsules (2 g total) by mouth 2 (two) times daily., Disp: 120 capsule, Rfl: 3   JARDIANCE 25 MG TABS tablet, TAKE 1 TABLET BY MOUTH EVERY MORNING., Disp: 90 tablet, Rfl: 0   metoprolol (TOPROL-XL) 200 MG 24 hr tablet, TAKE 1 TABLET BY MOUTH DAILY, Disp: 90 tablet, Rfl: 0   pioglitazone (ACTOS) 45 MG tablet, Take 1 tablet (45 mg total) by mouth daily., Disp: 90 tablet, Rfl: 1   valsartan (DIOVAN) 160 MG tablet, Take 1 tablet (160 mg total) by mouth daily., Disp: 90 tablet, Rfl: 1   Allergies  Allergen Reactions   Metformin And Related Nausea Only   CONSTITUTIONAL: Negative for chills, fatigue, fever, unintentional weight gain and unintentional weight loss.  E/N/T: Negative for ear pain, nasal congestion and sore throat.  CARDIOVASCULAR: Negative for chest pain, dizziness, palpitations and pedal edema.  RESPIRATORY: Negative for recent cough and dyspnea.  GASTROINTESTINAL: Negative for abdominal pain, acid reflux symptoms, constipation, diarrhea, nausea and vomiting.  MSK: Negative for arthralgias and myalgias.  INTEGUMENTARY: Negative for rash.  PSYCHIATRIC: Negative for sleep disturbance and to question depression screen.  Negative for depression, negative for anhedonia.       Objective:  PHYSICAL EXAM:   VS: BP 138/74    Pulse 98     Temp (!) 95.6 F (35.3 C)    Ht 6' 2"  (1.88 m)    Wt (!) 496 lb (225 kg)    SpO2 98%    BMI 63.68 kg/m   GEN: Well nourished, well developed, in no acute distress - morbidly obese Cardiac: RRR; no murmurs, rubs, or gallops,no edema -is wearing support socks today Respiratory:  normal respiratory rate and pattern with no distress - normal breath sounds with no rales, rhonchi, wheezes or rubs MS: no deformity or atrophy  Skin: warm and dry, no rash  Psych: euthymic mood, appropriate affect and demeanor    No visits with results within 1 Day(s) from this visit.  Latest known visit with results is:  Office Visit on 07/21/2021  Component Date Value Ref Range Status   SARS Coronavirus 2 Ag 07/24/2021 Negative  Negative Final   Influenza A, POC  07/24/2021 Negative  Negative Final   Influenza B, POC 07/24/2021 Negative  Negative Final    BP 138/74    Pulse 98    Temp (!) 95.6 F (35.3 C)    Ht 6' 2"  (1.88 m)    Wt (!) 496 lb (225 kg)    SpO2 98%    BMI 63.68 kg/m  Wt Readings from Last 3 Encounters:  10/10/21 (!) 496 lb (225 kg)  07/21/21 (!) 493 lb (223.6 kg)  07/05/21 (!) 486 lb (220.4 kg)     There are no preventive care reminders to display for this patient.   There are no preventive care reminders to display for this patient.  Lab Results  Component Value Date   TSH 3.040 06/09/2020   Lab Results  Component Value Date   WBC 6.2 07/05/2021   HGB 16.8 07/05/2021   HCT 49.3 07/05/2021   MCV 83 07/05/2021   PLT 191 07/05/2021   Lab Results  Component Value Date   NA 140 07/05/2021   K 4.1 07/05/2021   CO2 27 07/05/2021   GLUCOSE 167 (H) 07/05/2021   BUN 11 07/05/2021   CREATININE 0.78 07/05/2021   BILITOT 0.7 07/05/2021   ALKPHOS 71 07/05/2021   AST 22 07/05/2021   ALT 25 07/05/2021   PROT 7.1 07/05/2021   ALBUMIN 4.5 07/05/2021   CALCIUM 10.0 07/05/2021   ANIONGAP 7 07/20/2015   EGFR 118 07/05/2021   Lab Results  Component Value Date   CHOL 203 (H)  07/05/2021   Lab Results  Component Value Date   HDL 46 07/05/2021   Lab Results  Component Value Date   LDLCALC 122 (H) 07/05/2021   Lab Results  Component Value Date   TRIG 198 (H) 07/05/2021   Lab Results  Component Value Date   CHOLHDL 4.4 07/05/2021   Lab Results  Component Value Date   HGBA1C 8.1 (H) 07/05/2021      Assessment & Plan:   Problem List Items Addressed This Visit       Cardiovascular and Mediastinum   Hypertension associated with diabetes ([place])   Relevant Medications   Dulaglutide (TRULICITY) 3 DP/8.2UM SOPN   Other Relevant Orders   CBC with Differential/Platelet   Comprehensive metabolic panel   Lipid panel Continue meds Effort at weight loss/ low sodium diet     Endocrine   Type 2 diabetes mellitus (HCC) - Primary   Relevant Medications   Dulaglutide (TRULICITY) 3 PN/3.6RW SOPN   Other Relevant Orders   CBC with Differential/Platelet   Comprehensive metabolic panel   Hemoglobin A1c Continue efforts at weight loss Low carb/glucose diet     Other   Mixed hyperlipidemia   Relevant Orders   Lipid panel Continue meds Low chol diet     Meds ordered this encounter  Medications   Dulaglutide (TRULICITY) 3 ER/1.5QM SOPN    Sig: Inject 3 mg as directed once a week.    Dispense:  0.5 mL    Refill:  5    Order Specific Question:   Supervising Provider    Answer:   Shelton Silvas    Follow-up: Return in about 3 months (around 01/07/2022) for chronic fasting.    SARA R Kaitlyn Skowron, PA-C

## 2021-10-11 LAB — CARDIOVASCULAR RISK ASSESSMENT

## 2021-10-11 LAB — CBC WITH DIFFERENTIAL/PLATELET
Basophils Absolute: 0.1 10*3/uL (ref 0.0–0.2)
Basos: 1 %
EOS (ABSOLUTE): 0.1 10*3/uL (ref 0.0–0.4)
Eos: 1 %
Hematocrit: 50.8 % (ref 37.5–51.0)
Hemoglobin: 16.7 g/dL (ref 13.0–17.7)
Immature Grans (Abs): 0 10*3/uL (ref 0.0–0.1)
Immature Granulocytes: 0 %
Lymphocytes Absolute: 1.8 10*3/uL (ref 0.7–3.1)
Lymphs: 32 %
MCH: 28.5 pg (ref 26.6–33.0)
MCHC: 32.9 g/dL (ref 31.5–35.7)
MCV: 87 fL (ref 79–97)
Monocytes Absolute: 0.4 10*3/uL (ref 0.1–0.9)
Monocytes: 8 %
Neutrophils Absolute: 3.3 10*3/uL (ref 1.4–7.0)
Neutrophils: 58 %
Platelets: 156 10*3/uL (ref 150–450)
RBC: 5.85 x10E6/uL — ABNORMAL HIGH (ref 4.14–5.80)
RDW: 13.9 % (ref 11.6–15.4)
WBC: 5.6 10*3/uL (ref 3.4–10.8)

## 2021-10-11 LAB — LIPID PANEL
Chol/HDL Ratio: 4.2 ratio (ref 0.0–5.0)
Cholesterol, Total: 196 mg/dL (ref 100–199)
HDL: 47 mg/dL
LDL Chol Calc (NIH): 107 mg/dL — ABNORMAL HIGH (ref 0–99)
Triglycerides: 242 mg/dL — ABNORMAL HIGH (ref 0–149)
VLDL Cholesterol Cal: 42 mg/dL — ABNORMAL HIGH (ref 5–40)

## 2021-10-11 LAB — COMPREHENSIVE METABOLIC PANEL
ALT: 25 IU/L (ref 0–44)
AST: 19 IU/L (ref 0–40)
Albumin/Globulin Ratio: 1.9 (ref 1.2–2.2)
Albumin: 4.3 g/dL (ref 4.0–5.0)
Alkaline Phosphatase: 72 IU/L (ref 44–121)
BUN/Creatinine Ratio: 17 (ref 9–20)
BUN: 12 mg/dL (ref 6–20)
Bilirubin Total: 0.4 mg/dL (ref 0.0–1.2)
CO2: 25 mmol/L (ref 20–29)
Calcium: 9.5 mg/dL (ref 8.7–10.2)
Chloride: 100 mmol/L (ref 96–106)
Creatinine, Ser: 0.7 mg/dL — ABNORMAL LOW (ref 0.76–1.27)
Globulin, Total: 2.3 g/dL (ref 1.5–4.5)
Glucose: 273 mg/dL — ABNORMAL HIGH (ref 70–99)
Potassium: 4.5 mmol/L (ref 3.5–5.2)
Sodium: 139 mmol/L (ref 134–144)
Total Protein: 6.6 g/dL (ref 6.0–8.5)
eGFR: 122 mL/min/{1.73_m2} (ref >59)

## 2021-10-11 LAB — HEMOGLOBIN A1C
Est. average glucose Bld gHb Est-mCnc: 223 mg/dL
Hgb A1c MFr Bld: 9.4 % — ABNORMAL HIGH (ref 4.8–5.6)

## 2021-12-05 ENCOUNTER — Other Ambulatory Visit: Payer: Self-pay | Admitting: Physician Assistant

## 2021-12-05 DIAGNOSIS — E1165 Type 2 diabetes mellitus with hyperglycemia: Secondary | ICD-10-CM

## 2022-01-11 ENCOUNTER — Other Ambulatory Visit: Payer: Self-pay | Admitting: Physician Assistant

## 2022-01-11 ENCOUNTER — Ambulatory Visit: Payer: BC Managed Care – PPO | Admitting: Physician Assistant

## 2022-01-11 DIAGNOSIS — E1159 Type 2 diabetes mellitus with other circulatory complications: Secondary | ICD-10-CM

## 2022-01-11 DIAGNOSIS — E1165 Type 2 diabetes mellitus with hyperglycemia: Secondary | ICD-10-CM

## 2022-02-07 ENCOUNTER — Other Ambulatory Visit: Payer: Self-pay | Admitting: Physician Assistant

## 2022-02-07 DIAGNOSIS — E1159 Type 2 diabetes mellitus with other circulatory complications: Secondary | ICD-10-CM

## 2022-03-01 ENCOUNTER — Ambulatory Visit: Payer: BC Managed Care – PPO | Admitting: Physician Assistant

## 2022-03-01 ENCOUNTER — Encounter: Payer: Self-pay | Admitting: Physician Assistant

## 2022-03-01 VITALS — BP 122/80 | HR 92 | Temp 97.3°F | Ht 74.0 in | Wt >= 6400 oz

## 2022-03-01 DIAGNOSIS — K429 Umbilical hernia without obstruction or gangrene: Secondary | ICD-10-CM

## 2022-03-01 DIAGNOSIS — I152 Hypertension secondary to endocrine disorders: Secondary | ICD-10-CM | POA: Diagnosis not present

## 2022-03-01 DIAGNOSIS — E782 Mixed hyperlipidemia: Secondary | ICD-10-CM | POA: Diagnosis not present

## 2022-03-01 DIAGNOSIS — E1159 Type 2 diabetes mellitus with other circulatory complications: Secondary | ICD-10-CM | POA: Diagnosis not present

## 2022-03-01 DIAGNOSIS — E1165 Type 2 diabetes mellitus with hyperglycemia: Secondary | ICD-10-CM | POA: Diagnosis not present

## 2022-03-01 DIAGNOSIS — Z6841 Body Mass Index (BMI) 40.0 and over, adult: Secondary | ICD-10-CM

## 2022-03-01 NOTE — Progress Notes (Signed)
Established Patient Office Visit  Subjective:  Patient ID: [employer], male    DOB: 29-Nov-1985  Age: 36 y.o. MRN: [id number]  CC:  Chief Complaint  Patient presents with   diabetes    HPI William Massey presents for follow up diabetes     Patient presents with type 2 diabetes mellitus with other specified complication.  Specifically, this is type 2, non-insulin requiring diabetes, complicated by hypertension, obesity, and hyperlipidemia.  Date of diagnosis 2014.  Compliance with treatment has been good -    He specifically denies associated symptoms, including blurred vision, fatigue, headache, hypoglycemia, nocturia, peripheral neuropathy Tobacco screen: Non-smoker.  Current meds include an oral hypoglycemic ( Jardiance 25 mg QD ),actos 82m qd  insulin/injectable ( Trulicity 3.0 mg injection once a week ),  Lotensin, and lipid lowering agent ( Lipitor ). Pt states glucose ranges 80-160 during day     Pt presents for follow up of hypertension.  Date of diagnosis 2014.  His current cardiac medication regimen includes , a beta-blocker ( Metoprolol ER 200 mg QD ), amlodopine 10  and diovan 1623mand also hctz 2570md  Compliance with treatment has been fair; he HAS STARTED AN EXERCISE REGIMEN AND WATCHING DIET BETTER - Concurrent health problems include hyperlipidemia and diabetes.   Pt presents with hyperlipidemia.  Date of diagnosis 01/24/2019.  Current treatment includes Lipitor 80 and vascepa  Compliance with treatment has been good; he takes his medication as directed, maintains his low cholesterol diet, and follows up as directed.  He denies experiencing any hypercholesterolemia related symptoms.  Concurrent health problems include hypertension and diabetes.   Pt complains with umbilical hernia - it has been there for awhile but over the years getting larger - denies any pain from it He is considering having gastric bypass and will wait to consult with surgeon about hernia when he  decides about consult for bypass surgery Past Medical History:  Diagnosis Date   Asthma    [place]  Generalized anxiety disorder    Hypertension    Mild intermittent asthma, uncomplicated    Mixed hyperlipidemia    Morbid (severe) obesity due to excess calories ([place]  Personal history of malignant neoplasm of testis     Past Surgical History:  Procedure Laterality Date   ORCHIECTOMY Left 02/19/2015   Procedure: RADICAL ORCHIECTOMY;  Surgeon: PatCleon GustinD;  Location: [facility];  Service: Urology;  Laterality: Left;    Family History  Problem Relation Age of Onset   Hypertension Father    Diabetes type II Father    Leukemia Brother     Social History   Socioeconomic History   Marital status: Single    Spouse name: Not on file   Number of children: 1   Years of education: Not on file   Highest education level: Not on file  Occupational History   Not on file  Tobacco Use   Smoking status: Never   Smokeless tobacco: Never  Vaping Use   Vaping Use: Never used  Substance and Sexual Activity   Alcohol use: Yes    Comment: Drinks alcohol very infrequently. He typically consumes beer   Drug use: Not Currently    Frequency: 0.2 times per week    Types: Marijuana   Sexual activity: Yes    Partners: Female  Other Topics Concern   Not on file  Social History Narrative   Not on file   Social  Determinants of Health   Financial Resource Strain: Not on file  Food Insecurity: Not on file  Transportation Needs: Not on file  Physical Activity: Not on file  Stress: Not on file  Social Connections: Not on file  Intimate Partner Violence: Not on file     Current Outpatient Medications:    albuterol (VENTOLIN HFA) 108 (90 Base) MCG/ACT inhaler, Inhale 1-2 puffs into the lungs every 6 (six) hours as needed for wheezing or shortness of breath., Disp: , Rfl:    amLODipine (NORVASC) 10 MG tablet, TAKE 1 TABLET BY MOUTH DAILY, Disp:  90 tablet, Rfl: 0   atorvastatin (LIPITOR) 80 MG tablet, Take 1 tablet (80 mg total) by mouth daily., Disp: 90 tablet, Rfl: 1   Continuous Blood Gluc Sensor (FREESTYLE LIBRE 2 SENSOR) MISC, Check sugars 3x a day before meals. E11.65, Disp: 6 each, Rfl: 0   Dulaglutide (TRULICITY) 3 JO/8.4ZY SOPN, Inject 3 mg as directed once a week., Disp: 0.5 mL, Rfl: 5   fluticasone (FLONASE) 50 MCG/ACT nasal spray, Place 2 sprays into both nostrils daily., Disp: 16 g, Rfl: 6   hydrochlorothiazide (HYDRODIURIL) 25 MG tablet, TAKE 1 TABLET BY MOUTH DAILY, Disp: 100 tablet, Rfl: 0   icosapent Ethyl (VASCEPA) 1 g capsule, Take 2 capsules (2 g total) by mouth 2 (two) times daily., Disp: 120 capsule, Rfl: 3   JARDIANCE 25 MG TABS tablet, TAKE 1 TABLET BY MOUTH EVERY MORNING., Disp: 90 tablet, Rfl: 0   metoprolol (TOPROL-XL) 200 MG 24 hr tablet, TAKE 1 TABLET BY MOUTH DAILY, Disp: 90 tablet, Rfl: 0   pioglitazone (ACTOS) 45 MG tablet, TAKE 1 TABLET BY MOUTH DAILY, Disp: 90 tablet, Rfl: 0   valsartan (DIOVAN) 160 MG tablet, Take 1 tablet (160 mg total) by mouth daily., Disp: 90 tablet, Rfl: 1   Allergies  Allergen Reactions   Metformin And Related Nausea Only   CONSTITUTIONAL: Negative for chills, fatigue, fever, unintentional weight gain and unintentional weight loss.  E/N/T: Negative for ear pain, nasal congestion and sore throat.  CARDIOVASCULAR: Negative for chest pain, dizziness, palpitations and pedal edema.  RESPIRATORY: Negative for recent cough and dyspnea.  GASTROINTESTINAL: Negative for abdominal pain, acid reflux symptoms, constipation, diarrhea, nausea and vomiting.  MSK: see HPI INTEGUMENTARY: Negative for rash.  NEUROLOGICAL: Negative for dizziness and headaches.  PSYCHIATRIC: Negative for sleep disturbance and to question depression screen.  Negative for depression, negative for anhedonia.       Objective:  PHYSICAL EXAM:   VS: BP 122/80 (BP Location: Right Arm, Patient Position: Sitting,  Cuff Size: Large)   Pulse 92   Temp (!) 97.3 F (36.3 C) (Temporal)   Ht 6' 2"  (1.88 m)   Wt (!) 491 lb (222.7 kg)   SpO2 95%   BMI 63.04 kg/m   GEN: Well nourished, well developed, in no acute distress - morbidly obese  Cardiac: RRR; no murmurs, rubs, or gallops,no edema - Respiratory:  normal respiratory rate and pattern with no distress - normal breath sounds with no rales, rhonchi, wheezes or rubs SA:YTKZS umbilical hernia noted MS: no deformity or atrophy  Skin: warm and dry, no rash   Psych: euthymic mood, appropriate affect and demeanor Diabetic Foot Exam - Simple   Simple Foot Form Visual Inspection No deformities, no ulcerations, no other skin breakdown bilaterally: Yes See comments: Yes Sensation Testing Intact to touch and monofilament testing bilaterally: Yes Pulse Check Posterior Tibialis and Dorsalis pulse intact bilaterally: Yes Comments Patient has a little  swelling in his both feet.     No visits with results within 1 Day(s) from this visit.  Latest known visit with results is:  Office Visit on 10/10/2021  Component Date Value Ref Range Status   WBC 10/10/2021 5.6  3.4 - 10.8 x10E3/uL Final   RBC 10/10/2021 5.85 (H)  4.14 - 5.80 x10E6/uL Final   Hemoglobin 10/10/2021 16.7  13.0 - 17.7 g/dL Final   Hematocrit 10/10/2021 50.8  37.5 - 51.0 % Final   MCV 10/10/2021 87  79 - 97 fL Final   MCH 10/10/2021 28.5  26.6 - 33.0 pg Final   MCHC 10/10/2021 32.9  31.5 - 35.7 g/dL Final   RDW 10/10/2021 13.9  11.6 - 15.4 % Final   Platelets 10/10/2021 156  150 - 450 x10E3/uL Final   Neutrophils 10/10/2021 58  Not Estab. % Final   Lymphs 10/10/2021 32  Not Estab. % Final   Monocytes 10/10/2021 8  Not Estab. % Final   Eos 10/10/2021 1  Not Estab. % Final   Basos 10/10/2021 1  Not Estab. % Final   Neutrophils Absolute 10/10/2021 3.3  1.4 - 7.0 x10E3/uL Final   Lymphocytes Absolute 10/10/2021 1.8  0.7 - 3.1 x10E3/uL Final   Monocytes Absolute 10/10/2021 0.4  0.1 -  0.9 x10E3/uL Final   EOS (ABSOLUTE) 10/10/2021 0.1  0.0 - 0.4 x10E3/uL Final   Basophils Absolute 10/10/2021 0.1  0.0 - 0.2 x10E3/uL Final   Immature Granulocytes 10/10/2021 0  Not Estab. % Final   Immature Grans (Abs) 10/10/2021 0.0  0.0 - 0.1 x10E3/uL Final   Glucose 10/10/2021 273 (H)  70 - 99 mg/dL Final   BUN 10/10/2021 12  6 - 20 mg/dL Final   Creatinine, Ser 10/10/2021 0.70 (L)  0.76 - 1.27 mg/dL Final   eGFR 10/10/2021 122  >59 mL/min/1.73 Final   BUN/Creatinine Ratio 10/10/2021 17  9 - 20 Final   Sodium 10/10/2021 139  134 - 144 mmol/L Final   Potassium 10/10/2021 4.5  3.5 - 5.2 mmol/L Final   Chloride 10/10/2021 100  96 - 106 mmol/L Final   CO2 10/10/2021 25  20 - 29 mmol/L Final   Calcium 10/10/2021 9.5  8.7 - 10.2 mg/dL Final   Total Protein 10/10/2021 6.6  6.0 - 8.5 g/dL Final   Albumin 10/10/2021 4.3  4.0 - 5.0 g/dL Final   Globulin, Total 10/10/2021 2.3  1.5 - 4.5 g/dL Final   Albumin/Globulin Ratio 10/10/2021 1.9  1.2 - 2.2 Final   Bilirubin Total 10/10/2021 0.4  0.0 - 1.2 mg/dL Final   Alkaline Phosphatase 10/10/2021 72  44 - 121 IU/L Final   AST 10/10/2021 19  0 - 40 IU/L Final   ALT 10/10/2021 25  0 - 44 IU/L Final   Hgb A1c MFr Bld 10/10/2021 9.4 (H)  4.8 - 5.6 % Final   Comment:          Prediabetes: 5.7 - 6.4          Diabetes: >6.4          Glycemic control for adults with diabetes: <7.0    Est. average glucose Bld gHb Est-m* 10/10/2021 223  mg/dL Final   Cholesterol, Total 10/10/2021 196  100 - 199 mg/dL Final   Triglycerides 10/10/2021 242 (H)  0 - 149 mg/dL Final   HDL 10/10/2021 47  >39 mg/dL Final   VLDL Cholesterol Cal 10/10/2021 42 (H)  5 - 40 mg/dL Final   LDL Chol Calc (  NIH) 10/10/2021 107 (H)  0 - 99 mg/dL Final   Chol/HDL Ratio 10/10/2021 4.2  0.0 - 5.0 ratio Final   Comment:                                   T. Chol/HDL Ratio                                             Men  Women                               1/2 Avg.Risk  3.4    3.3                                    Avg.Risk  5.0    4.4                                2X Avg.Risk  9.6    7.1                                3X Avg.Risk 23.4   11.0    Interpretation 10/10/2021 Note   Final   Supplemental report is available.    BP 122/80 (BP Location: Right Arm, Patient Position: Sitting, Cuff Size: Large)   Pulse 92   Temp (!) 97.3 F (36.3 C) (Temporal)   Ht 6' 2"  (1.88 m)   Wt (!) 491 lb (222.7 kg)   SpO2 95%   BMI 63.04 kg/m  Wt Readings from Last 3 Encounters:  03/01/22 (!) 491 lb (222.7 kg)  10/10/21 (!) 496 lb (225 kg)  07/21/21 (!) 493 lb (223.6 kg)     Health Maintenance Due  Topic Date Due   OPHTHALMOLOGY EXAM  Never done     There are no preventive care reminders to display for this patient.  Lab Results  Component Value Date   TSH 3.040 06/09/2020   Lab Results  Component Value Date   WBC 5.6 10/10/2021   HGB 16.7 10/10/2021   HCT 50.8 10/10/2021   MCV 87 10/10/2021   PLT 156 10/10/2021   Lab Results  Component Value Date   NA 139 10/10/2021   K 4.5 10/10/2021   CO2 25 10/10/2021   GLUCOSE 273 (H) 10/10/2021   BUN 12 10/10/2021   CREATININE 0.70 (L) 10/10/2021   BILITOT 0.4 10/10/2021   ALKPHOS 72 10/10/2021   AST 19 10/10/2021   ALT 25 10/10/2021   PROT 6.6 10/10/2021   ALBUMIN 4.3 10/10/2021   CALCIUM 9.5 10/10/2021   ANIONGAP 7 07/20/2015   EGFR 122 10/10/2021   Lab Results  Component Value Date   CHOL 196 10/10/2021   Lab Results  Component Value Date   HDL 47 10/10/2021   Lab Results  Component Value Date   LDLCALC 107 (H) 10/10/2021   Lab Results  Component Value Date   TRIG 242 (H) 10/10/2021   Lab Results  Component Value Date   CHOLHDL 4.2 10/10/2021   Lab Results  Component Value Date  HGBA1C 9.4 (H) 10/10/2021      Assessment & Plan:   Problem List Items Addressed This Visit       Cardiovascular and Mediastinum   Hypertension associated with diabetes ([place])   Relevant Medications    Dulaglutide (TRULICITY) 3 BV/6.7OL SOPN Continue other meds as directed   Other Relevant Orders   CBC with Differential/Platelet   Comprehensive metabolic panel   Lipid panel Continue meds Effort at weight loss/ low sodium diet     Endocrine   Type 2 diabetes mellitus (HCC) - Primary   Relevant Medications   Dulaglutide (TRULICITY) 3 ID/0.3UD SOPN   Other Relevant Orders   CBC with Differential/Platelet   Comprehensive metabolic panel   Hemoglobin A1c Continue efforts at weight loss Low carb/glucose diet     Other   Mixed hyperlipidemia   Relevant Orders   Lipid panel Continue meds Low chol diet  Umbilical hernia Pt to let us know when he would like surgical consult       No orders of the defined types were placed in this encounter.   Follow-up: Return in about 4 months (around 07/02/2022) for chronic fasting follow up.    SARA R Preethi Scantlebury, PA-C

## 2022-03-02 LAB — MICROALBUMIN / CREATININE URINE RATIO
Creatinine, Urine: 28.1 mg/dL
Microalb/Creat Ratio: <11 mg/g creat (ref 0–29)
Microalbumin, Urine: <3 ug/mL

## 2022-03-02 LAB — CBC WITH DIFFERENTIAL/PLATELET
Basophils Absolute: 0.1 10*3/uL (ref 0.0–0.2)
Basos: 1 %
EOS (ABSOLUTE): 0.1 10*3/uL (ref 0.0–0.4)
Eos: 1 %
Hematocrit: 48.7 % (ref 37.5–51.0)
Hemoglobin: 15.4 g/dL (ref 13.0–17.7)
Immature Grans (Abs): 0 10*3/uL (ref 0.0–0.1)
Immature Granulocytes: 0 %
Lymphocytes Absolute: 2.2 10*3/uL (ref 0.7–3.1)
Lymphs: 30 %
MCH: 26.9 pg (ref 26.6–33.0)
MCHC: 31.6 g/dL (ref 31.5–35.7)
MCV: 85 fL (ref 79–97)
Monocytes Absolute: 0.7 10*3/uL (ref 0.1–0.9)
Monocytes: 9 %
Neutrophils Absolute: 4.4 10*3/uL (ref 1.4–7.0)
Neutrophils: 59 %
Platelets: 193 10*3/uL (ref 150–450)
RBC: 5.72 x10E6/uL (ref 4.14–5.80)
RDW: 13.6 % (ref 11.6–15.4)
WBC: 7.4 10*3/uL (ref 3.4–10.8)

## 2022-03-02 LAB — LIPID PANEL
Chol/HDL Ratio: 4 ratio (ref 0.0–5.0)
Cholesterol, Total: 187 mg/dL (ref 100–199)
HDL: 47 mg/dL (ref >39)
LDL Chol Calc (NIH): 94 mg/dL (ref 0–99)
Triglycerides: 272 mg/dL — ABNORMAL HIGH (ref 0–149)
VLDL Cholesterol Cal: 46 mg/dL — ABNORMAL HIGH (ref 5–40)

## 2022-03-02 LAB — COMPREHENSIVE METABOLIC PANEL
ALT: 29 IU/L (ref 0–44)
AST: 20 IU/L (ref 0–40)
Albumin/Globulin Ratio: 2 (ref 1.2–2.2)
Albumin: 4.7 g/dL (ref 4.1–5.1)
Alkaline Phosphatase: 75 IU/L (ref 44–121)
BUN/Creatinine Ratio: 18 (ref 9–20)
BUN: 14 mg/dL (ref 6–20)
Bilirubin Total: 0.9 mg/dL (ref 0.0–1.2)
CO2: 25 mmol/L (ref 20–29)
Calcium: 10.2 mg/dL (ref 8.7–10.2)
Chloride: 99 mmol/L (ref 96–106)
Creatinine, Ser: 0.8 mg/dL (ref 0.76–1.27)
Globulin, Total: 2.4 g/dL (ref 1.5–4.5)
Glucose: 172 mg/dL — ABNORMAL HIGH (ref 70–99)
Potassium: 4.4 mmol/L (ref 3.5–5.2)
Sodium: 140 mmol/L (ref 134–144)
Total Protein: 7.1 g/dL (ref 6.0–8.5)
eGFR: 116 mL/min/{1.73_m2} (ref >59)

## 2022-03-02 LAB — CARDIOVASCULAR RISK ASSESSMENT

## 2022-03-02 LAB — HEMOGLOBIN A1C
Est. average glucose Bld gHb Est-mCnc: 192 mg/dL
Hgb A1c MFr Bld: 8.3 % — ABNORMAL HIGH (ref 4.8–5.6)

## 2022-04-07 ENCOUNTER — Other Ambulatory Visit: Payer: Self-pay | Admitting: Physician Assistant

## 2022-04-07 DIAGNOSIS — E1159 Type 2 diabetes mellitus with other circulatory complications: Secondary | ICD-10-CM

## 2022-04-25 ENCOUNTER — Other Ambulatory Visit: Payer: Self-pay | Admitting: Physician Assistant

## 2022-04-25 DIAGNOSIS — E782 Mixed hyperlipidemia: Secondary | ICD-10-CM

## 2022-04-25 DIAGNOSIS — E1165 Type 2 diabetes mellitus with hyperglycemia: Secondary | ICD-10-CM

## 2022-04-25 MED ORDER — ATORVASTATIN CALCIUM 80 MG PO TABS
80.0000 mg | ORAL_TABLET | Freq: Every day | ORAL | 0 refills | Status: DC
Start: 2022-04-25 — End: 2023-01-11

## 2022-05-22 ENCOUNTER — Other Ambulatory Visit: Payer: Self-pay | Admitting: Physician Assistant

## 2022-05-22 DIAGNOSIS — E1165 Type 2 diabetes mellitus with hyperglycemia: Secondary | ICD-10-CM

## 2022-05-30 ENCOUNTER — Other Ambulatory Visit: Payer: Self-pay | Admitting: Family Medicine

## 2022-05-30 DIAGNOSIS — E1159 Type 2 diabetes mellitus with other circulatory complications: Secondary | ICD-10-CM

## 2022-05-30 DIAGNOSIS — E1165 Type 2 diabetes mellitus with hyperglycemia: Secondary | ICD-10-CM

## 2022-07-01 ENCOUNTER — Other Ambulatory Visit: Payer: Self-pay | Admitting: Physician Assistant

## 2022-07-01 DIAGNOSIS — E1159 Type 2 diabetes mellitus with other circulatory complications: Secondary | ICD-10-CM

## 2022-07-01 DIAGNOSIS — I152 Hypertension secondary to endocrine disorders: Secondary | ICD-10-CM

## 2022-07-03 ENCOUNTER — Ambulatory Visit: Payer: BC Managed Care – PPO | Admitting: Physician Assistant

## 2022-07-03 ENCOUNTER — Encounter: Payer: Self-pay | Admitting: Physician Assistant

## 2022-07-03 VITALS — BP 130/82 | HR 89 | Temp 97.4°F | Ht 74.0 in | Wt >= 6400 oz

## 2022-07-03 DIAGNOSIS — E1165 Type 2 diabetes mellitus with hyperglycemia: Secondary | ICD-10-CM | POA: Diagnosis not present

## 2022-07-03 DIAGNOSIS — I152 Hypertension secondary to endocrine disorders: Secondary | ICD-10-CM

## 2022-07-03 DIAGNOSIS — E1159 Type 2 diabetes mellitus with other circulatory complications: Secondary | ICD-10-CM | POA: Diagnosis not present

## 2022-07-03 DIAGNOSIS — E782 Mixed hyperlipidemia: Secondary | ICD-10-CM | POA: Diagnosis not present

## 2022-07-03 NOTE — Progress Notes (Signed)
Subjective:  Patient ID: William Massey, male    DOB: 1986-02-07  Age: 36 y.o. MRN: [id number]  Chief Complaint  Patient presents with   Hypertension   Diabetes   Hyperlipidemia    HPI  Pt presents for follow up of hypertension.  The patient is tolerating the medication well without side effects. Compliance with treatment has been good; including taking medication as directed , maintains a healthy diet and regular exercise regimen , and following up as directed. Pt currently on diovan 160mg  qd HCTZ 25mg  qd norvasc 10mg  and toprol XL 200mg  qd - bp 130/82  Mixed hyperlipidemia  Pt presents with hyperlipidemia.  Compliance with treatment has beengood The patient is compliant with medications, maintains a low cholesterol diet , follows up as directed , and maintains an exercise regimen . The patient denies experiencing any hypercholesterolemia related symptoms. Currenlty taking lipitor 80mg  qd and vascepa  Pt with history of NIDDM.  He is currently taking jardiance 25mg  qd, actos 45mg  qd and Trulicity 3mg  weekly - he states that fasting sugars range in the 120s but with eating it always goes up above 230-240s.  He states he is not exercising at this time but does try to make mostly healthy choices - would like to consider changing to ozempic or mounjaro Will check with insurance to see which is covered and will evaluate after lab results   Current Outpatient Medications on File Prior to Visit  Medication Sig Dispense Refill   albuterol (VENTOLIN HFA) 108 (90 Base) MCG/ACT inhaler Inhale 1-2 puffs into the lungs every 6 (six) hours as needed for wheezing or shortness of breath.     amLODipine (NORVASC) 10 MG tablet TAKE 1 TABLET BY MOUTH DAILY 90 tablet 0   atorvastatin (LIPITOR) 80 MG tablet Take 1 tablet (80 mg total) by mouth daily. 90 tablet 0   fluticasone (FLONASE) 50 MCG/ACT nasal spray Place 2 sprays into both nostrils daily. 16 g 6   hydrochlorothiazide (HYDRODIURIL) 25 MG tablet  TAKE 1 TABLET BY MOUTH DAILY 100 tablet 0   icosapent Ethyl (VASCEPA) 1 g capsule Take 2 capsules (2 g total) by mouth 2 (two) times daily. 120 capsule 3   JARDIANCE 25 MG TABS tablet TAKE 1 TABLET BY MOUTH EVERY MORNING. 90 tablet 0   metoprolol (TOPROL-XL) 200 MG 24 hr tablet TAKE 1 TABLET BY MOUTH DAILY 90 tablet 0   Multiple Vitamins-Minerals (MULTIVITAMIN ADULTS PO) Take by mouth.     pioglitazone (ACTOS) 45 MG tablet TAKE 1 TABLET BY MOUTH DAILY 90 tablet 0   TRULICITY 3 0000000 SOPN INJECT 3 MG AS DIRECTED ONCE A WEEK. 2 mL 4   valsartan (DIOVAN) 160 MG tablet TAKE 1 TABLET BY MOUTH DAILY. 90 tablet 0   No current facility-administered medications on file prior to visit.   Past Medical History:  Diagnosis Date   Asthma    Diabetes mellitus without complication ([place])    Generalized anxiety disorder    Hypertension    Mild intermittent asthma, uncomplicated    Mixed hyperlipidemia    Morbid (severe) obesity due to excess calories ([place])    Personal history of malignant neoplasm of testis    Past Surgical History:  Procedure Laterality Date   ORCHIECTOMY Left 02/19/2015   Procedure: RADICAL ORCHIECTOMY;  Surgeon: Cleon Gustin, MD;  Location: [facility];  Service: Urology;  Laterality: Left;    Family History  Problem Relation Age of Onset   Hypertension Father    Diabetes  type II Father    Leukemia Brother    Social History   Socioeconomic History   Marital status: Single    Spouse name: Not on file   Number of children: 1   Years of education: Not on file   Highest education level: Not on file  Occupational History   Not on file  Tobacco Use   Smoking status: Never   Smokeless tobacco: Never  Vaping Use   Vaping Use: Never used  Substance and Sexual Activity   Alcohol use: Yes    Comment: Drinks alcohol very infrequently. He typically consumes beer   Drug use: Not Currently    Frequency: 0.2 times per week    Types: Marijuana   Sexual activity: Yes     Partners: Female  Other Topics Concern   Not on file  Social History Narrative   Not on file   Social Determinants of Health   Financial Resource Strain: Not on file  Food Insecurity: Not on file  Transportation Needs: Not on file  Physical Activity: Not on file  Stress: Not on file  Social Connections: Not on file    Review of Systems CONSTITUTIONAL: Negative for chills, fatigue, fever, unintentional weight gain and unintentional weight loss.  E/N/T: Negative for ear pain, nasal congestion and sore throat.  CARDIOVASCULAR: Negative for chest pain, dizziness, palpitations and pedal edema.  RESPIRATORY: Negative for recent cough and dyspnea.  GASTROINTESTINAL: Negative for abdominal pain, acid reflux symptoms, constipation, diarrhea, nausea and vomiting.  INTEGUMENTARY: Negative for rash.        Objective:  PHYSICAL EXAM:   VS: BP 130/82   Pulse 89   Temp (!) 97.4 F (36.3 C)   Ht 6\' 2"  (1.88 m)   Wt (!) 491 lb (222.7 kg)   SpO2 94%   BMI 63.04 kg/m   GEN: Well nourished, well developed, in no acute distress  Cardiac: RRR; no murmurs, rubs, or gallops,no edema -  Respiratory:  normal respiratory rate and pattern with no distress - normal breath sounds with no rales, rhonchi, wheezes or rubs MS: no deformity or atrophy  Skin: warm and dry, no rash  Psych: euthymic mood, appropriate affect and demeanor   Lab Results  Component Value Date   WBC 7.4 03/01/2022   HGB 15.4 03/01/2022   HCT 48.7 03/01/2022   PLT 193 03/01/2022   GLUCOSE 172 (H) 03/01/2022   CHOL 187 03/01/2022   TRIG 272 (H) 03/01/2022   HDL 47 03/01/2022   LDLCALC 94 03/01/2022   ALT 29 03/01/2022   AST 20 03/01/2022   NA 140 03/01/2022   K 4.4 03/01/2022   CL 99 03/01/2022   CREATININE 0.80 03/01/2022   BUN 14 03/01/2022   CO2 25 03/01/2022   TSH 3.040 06/09/2020   HGBA1C 8.3 (H) 03/01/2022   MICROALBUR negative 11/27/2019      Assessment & Plan:   Problem List Items Addressed  This Visit       Cardiovascular and Mediastinum   Hypertension associated with diabetes (HCC) - Primary   Relevant Orders   CBC with Differential/Platelet   Comprehensive metabolic panel   TSH Continue current meds     Endocrine   Type 2 diabetes mellitus (HCC)   Relevant Orders   CBC with Differential/Platelet   Comprehensive metabolic panel   Hemoglobin A1c Continue meds and watch diet - will consider changing meds     Other   Mixed hyperlipidemia   Relevant Orders   Lipid  panel Continue meds  Low fat/low chol diet  .  No orders of the defined types were placed in this encounter.   Orders Placed This Encounter  Procedures   CBC with Differential/Platelet   Comprehensive metabolic panel   Lipid panel   Hemoglobin A1c   TSH     Follow-up: Return in about 3 months (around 10/03/2022) for chronic fasting follow up.  An After Visit Summary was printed and given to the patient.  Yetta Flock [facility] [phone]

## 2022-07-04 LAB — CBC WITH DIFFERENTIAL/PLATELET
Basophils Absolute: 0.1 10*3/uL (ref 0.0–0.2)
Basos: 1 %
EOS (ABSOLUTE): 0.1 10*3/uL (ref 0.0–0.4)
Eos: 1 %
Hematocrit: 51.9 % — ABNORMAL HIGH (ref 37.5–51.0)
Hemoglobin: 17.3 g/dL (ref 13.0–17.7)
Immature Grans (Abs): 0 10*3/uL (ref 0.0–0.1)
Immature Granulocytes: 0 %
Lymphocytes Absolute: 2.1 10*3/uL (ref 0.7–3.1)
Lymphs: 32 %
MCH: 28.3 pg (ref 26.6–33.0)
MCHC: 33.3 g/dL (ref 31.5–35.7)
MCV: 85 fL (ref 79–97)
Monocytes Absolute: 0.5 10*3/uL (ref 0.1–0.9)
Monocytes: 8 %
Neutrophils Absolute: 3.6 10*3/uL (ref 1.4–7.0)
Neutrophils: 58 %
Platelets: 198 10*3/uL (ref 150–450)
RBC: 6.11 x10E6/uL — ABNORMAL HIGH (ref 4.14–5.80)
RDW: 13.8 % (ref 11.6–15.4)
WBC: 6.4 10*3/uL (ref 3.4–10.8)

## 2022-07-04 LAB — LIPID PANEL
Chol/HDL Ratio: 3.8 ratio (ref 0.0–5.0)
Cholesterol, Total: 189 mg/dL (ref 100–199)
HDL: 50 mg/dL (ref >39)
LDL Chol Calc (NIH): 99 mg/dL (ref 0–99)
Triglycerides: 234 mg/dL — ABNORMAL HIGH (ref 0–149)
VLDL Cholesterol Cal: 40 mg/dL (ref 5–40)

## 2022-07-04 LAB — CARDIOVASCULAR RISK ASSESSMENT

## 2022-07-04 LAB — HEMOGLOBIN A1C
Est. average glucose Bld gHb Est-mCnc: 180 mg/dL
Hgb A1c MFr Bld: 7.9 % — ABNORMAL HIGH (ref 4.8–5.6)

## 2022-07-04 LAB — COMPREHENSIVE METABOLIC PANEL
ALT: 27 IU/L (ref 0–44)
AST: 16 IU/L (ref 0–40)
Albumin/Globulin Ratio: 2.1 (ref 1.2–2.2)
Albumin: 4.7 g/dL (ref 4.1–5.1)
Alkaline Phosphatase: 75 IU/L (ref 44–121)
BUN/Creatinine Ratio: 15 (ref 9–20)
BUN: 11 mg/dL (ref 6–20)
Bilirubin Total: 0.7 mg/dL (ref 0.0–1.2)
CO2: 25 mmol/L (ref 20–29)
Calcium: 9.9 mg/dL (ref 8.7–10.2)
Chloride: 99 mmol/L (ref 96–106)
Creatinine, Ser: 0.73 mg/dL — ABNORMAL LOW (ref 0.76–1.27)
Globulin, Total: 2.2 g/dL (ref 1.5–4.5)
Glucose: 164 mg/dL — ABNORMAL HIGH (ref 70–99)
Potassium: 4.3 mmol/L (ref 3.5–5.2)
Sodium: 141 mmol/L (ref 134–144)
Total Protein: 6.9 g/dL (ref 6.0–8.5)
eGFR: 119 mL/min/{1.73_m2} (ref >59)

## 2022-07-04 LAB — TSH: TSH: 3.57 u[IU]/mL (ref 0.450–4.500)

## 2022-07-24 DIAGNOSIS — J069 Acute upper respiratory infection, unspecified: Secondary | ICD-10-CM | POA: Diagnosis not present

## 2022-07-24 DIAGNOSIS — J101 Influenza due to other identified influenza virus with other respiratory manifestations: Secondary | ICD-10-CM | POA: Diagnosis not present

## 2022-07-24 DIAGNOSIS — R509 Fever, unspecified: Secondary | ICD-10-CM | POA: Diagnosis not present

## 2022-08-24 ENCOUNTER — Other Ambulatory Visit: Payer: Self-pay | Admitting: Physician Assistant

## 2022-08-24 ENCOUNTER — Other Ambulatory Visit: Payer: Self-pay | Admitting: Family Medicine

## 2022-08-24 DIAGNOSIS — E1159 Type 2 diabetes mellitus with other circulatory complications: Secondary | ICD-10-CM

## 2022-09-23 ENCOUNTER — Other Ambulatory Visit: Payer: Self-pay | Admitting: Physician Assistant

## 2022-10-12 ENCOUNTER — Other Ambulatory Visit: Payer: Self-pay | Admitting: Physician Assistant

## 2022-10-12 DIAGNOSIS — E1165 Type 2 diabetes mellitus with hyperglycemia: Secondary | ICD-10-CM

## 2022-10-13 ENCOUNTER — Ambulatory Visit: Payer: BC Managed Care – PPO | Admitting: Physician Assistant

## 2022-10-13 ENCOUNTER — Encounter: Payer: Self-pay | Admitting: Physician Assistant

## 2022-10-13 VITALS — BP 128/78 | HR 88 | Temp 97.6°F | Ht 74.0 in | Wt >= 6400 oz

## 2022-10-13 DIAGNOSIS — E1165 Type 2 diabetes mellitus with hyperglycemia: Secondary | ICD-10-CM

## 2022-10-13 DIAGNOSIS — E1159 Type 2 diabetes mellitus with other circulatory complications: Secondary | ICD-10-CM

## 2022-10-13 DIAGNOSIS — I152 Hypertension secondary to endocrine disorders: Secondary | ICD-10-CM

## 2022-10-13 DIAGNOSIS — E782 Mixed hyperlipidemia: Secondary | ICD-10-CM | POA: Diagnosis not present

## 2022-10-13 DIAGNOSIS — E1169 Type 2 diabetes mellitus with other specified complication: Secondary | ICD-10-CM

## 2022-10-13 MED ORDER — PIOGLITAZONE HCL 45 MG PO TABS: 45.0000 mg | ORAL_TABLET | Freq: Every day | ORAL | 1 refills | Status: DC

## 2022-10-13 MED ORDER — FREESTYLE LIBRE 14 DAY SENSOR MISC: 1.0000 | 5 refills | Status: DC

## 2022-10-13 MED ORDER — FREESTYLE LIBRE 14 DAY READER DEVI: 1.0000 | Freq: Every day | 0 refills | Status: DC

## 2022-10-13 MED ORDER — EMPAGLIFLOZIN 25 MG PO TABS: 25.0000 mg | ORAL_TABLET | Freq: Every morning | ORAL | 1 refills | Status: DC

## 2022-10-13 MED ORDER — AMLODIPINE BESYLATE 10 MG PO TABS: 10.0000 mg | ORAL_TABLET | Freq: Every day | ORAL | 1 refills | Status: DC

## 2022-10-13 MED ORDER — TIRZEPATIDE 2.5 MG/0.5ML ~~LOC~~ SOAJ: 2.5000 mg | SUBCUTANEOUS | 0 refills | Status: DC

## 2022-10-13 NOTE — Progress Notes (Signed)
Subjective:  Patient ID: William Massey, male    DOB: 09/27/1985  Age: 36 y.o. MRN: [id number]  Chief Complaint  Patient presents with   Diabetes   Hypertension    Diabetes  Hypertension    Pt presents for follow up of hypertension.  The patient is tolerating the medication well without side effects. Compliance with treatment has been good; including taking medication as directed , maintains a healthy diet and regular exercise regimen , and following up as directed. Pt currently on diovan '160mg'$  qd HCTZ '25mg'$  qd norvasc '10mg'$  and toprol XL '200mg'$  qd - bp 128/78  Mixed hyperlipidemia  Pt presents with hyperlipidemia.  Compliance with treatment has beengood The patient is compliant with medications, maintains a low cholesterol diet , follows up as directed , and maintains an exercise regimen . The patient denies experiencing any hypercholesterolemia related symptoms. Currenlty taking lipitor '80mg'$  qd and vascepa  Pt with history of NIDDM.  He is currently taking jardiance '25mg'$  qd, actos '45mg'$  qd and supposed to be taking Trulicity but has not been able to get at pharmacy for over 3 weeks - would like to see if he can get started on Mounjaro instead   He states he is not exercising at this time but does try to make mostly healthy choices - He requests to get Dexcom device  Current Outpatient Medications on File Prior to Visit  Medication Sig Dispense Refill   albuterol (VENTOLIN HFA) 108 (90 Base) MCG/ACT inhaler Inhale 1-2 puffs into the lungs every 6 (six) hours as needed for wheezing or shortness of breath.     atorvastatin (LIPITOR) 80 MG tablet Take 1 tablet (80 mg total) by mouth daily. 90 tablet 0   fluticasone (FLONASE) 50 MCG/ACT nasal spray Place 2 sprays into both nostrils daily. 16 g 6   hydrochlorothiazide (HYDRODIURIL) 25 MG tablet TAKE 1 TABLET BY MOUTH DAILY 100 tablet 0   metoprolol (TOPROL-XL) 200 MG 24 hr tablet TAKE 1 TABLET BY MOUTH DAILY 90 tablet 0   Multiple  Vitamins-Minerals (MULTIVITAMIN ADULTS PO) Take by mouth.     valsartan (DIOVAN) 160 MG tablet TAKE 1 TABLET BY MOUTH DAILY. 90 tablet 0   VASCEPA 1 g capsule TAKE 2 CAPSULES BY MOUTH 2 TIMES DAILY. 120 capsule 2   No current facility-administered medications on file prior to visit.   Past Medical History:  Diagnosis Date   Asthma    Diabetes mellitus without complication ([place])    Generalized anxiety disorder    Hypertension    Mild intermittent asthma, uncomplicated    Mixed hyperlipidemia    Morbid (severe) obesity due to excess calories ([place])    Personal history of malignant neoplasm of testis    Past Surgical History:  Procedure Laterality Date   ORCHIECTOMY Left 02/19/2015   Procedure: RADICAL ORCHIECTOMY;  Surgeon: Cleon Gustin, MD;  Location: [facility];  Service: Urology;  Laterality: Left;    Family History  Problem Relation Age of Onset   Hypertension Father    Diabetes type II Father    Leukemia Brother    Social History   Socioeconomic History   Marital status: Single    Spouse name: Not on file   Number of children: 1   Years of education: Not on file   Highest education level: Not on file  Occupational History   Not on file  Tobacco Use   Smoking status: Never   Smokeless tobacco: Never  Vaping Use   Vaping Use:  Never used  Substance and Sexual Activity   Alcohol use: Yes    Comment: Drinks alcohol very infrequently. He typically consumes beer   Drug use: Not Currently    Frequency: 0.2 times per week    Types: Marijuana   Sexual activity: Yes    Partners: Female  Other Topics Concern   Not on file  Social History Narrative   Not on file   Social Determinants of Health   Financial Resource Strain: Not on file  Food Insecurity: Not on file  Transportation Needs: Not on file  Physical Activity: Not on file  Stress: Not on file  Social Connections: Not on file   CONSTITUTIONAL: Negative for chills, fatigue, fever, unintentional weight gain  and unintentional weight loss.  E/N/T: Negative for ear pain, nasal congestion and sore throat.  CARDIOVASCULAR: Negative for chest pain, dizziness, palpitations and pedal edema.  RESPIRATORY: Negative for recent cough and dyspnea.  GASTROINTESTINAL: Negative for abdominal pain, acid reflux symptoms, constipation, diarrhea, nausea and vomiting.  MSK: Negative for arthralgias and myalgias.  INTEGUMENTARY: Negative for rash.  NEUROLOGICAL: Negative for dizziness and headaches.  PSYCHIATRIC: Negative for sleep disturbance and to question depression screen.  Negative for depression, negative for anhedonia.       Objective:  PHYSICAL EXAM:   VS: BP 128/78   Pulse 88   Temp 97.6 F (36.4 C)   Ht '6\' 2"'$  (1.88 m)   Wt (!) 485 lb (220 kg)   SpO2 99%   BMI 62.27 kg/m   GEN: Well nourished, well developed, in no acute distress  Cardiac: RRR; no murmurs, rubs, or gallops,no edema -  Respiratory:  normal respiratory rate and pattern with no distress - normal breath sounds with no rales, rhonchi, wheezes or rubs GI: normal bowel sounds, no masses or tenderness MS: no deformity or atrophy  Skin: warm and dry, no rash  Neuro:  Alert and Oriented x 3, Strength and sensation are intact - CN II-Xii grossly intact Psych: euthymic mood, appropriate affect and demeanor   Lab Results  Component Value Date   WBC 6.4 07/03/2022   HGB 17.3 07/03/2022   HCT 51.9 (H) 07/03/2022   PLT 198 07/03/2022   GLUCOSE 164 (H) 07/03/2022   CHOL 189 07/03/2022   TRIG 234 (H) 07/03/2022   HDL 50 07/03/2022   LDLCALC 99 07/03/2022   ALT 27 07/03/2022   AST 16 07/03/2022   NA 141 07/03/2022   K 4.3 07/03/2022   CL 99 07/03/2022   CREATININE 0.73 (L) 07/03/2022   BUN 11 07/03/2022   CO2 25 07/03/2022   TSH 3.570 07/03/2022   HGBA1C 7.9 (H) 07/03/2022   MICROALBUR negative 11/27/2019      Assessment & Plan:   Problem List Items Addressed This Visit       Cardiovascular and Mediastinum    Hypertension associated with diabetes ([place]) - Primary   Relevant Orders   CBC with Differential/Platelet   Comprehensive metabolic panel   TSH Continue current meds     Endocrine   Type 2 diabetes mellitus ([place])   Relevant Orders   CBC with Differential/Platelet   Comprehensive metabolic panel   Hemoglobin [id number] Stop trulicity Rx for mounjaro Rx for dexcom 7     Other   Mixed hyperlipidemia   Relevant Orders   Lipid panel Continue meds  Low fat/low chol diet  .  Meds ordered this encounter  Medications   pioglitazone (ACTOS) 45 MG tablet    Sig:  Take 1 tablet (45 mg total) by mouth daily.    Dispense:  90 tablet    Refill:  1   amLODipine (NORVASC) 10 MG tablet    Sig: Take 1 tablet (10 mg total) by mouth daily.    Dispense:  90 tablet    Refill:  1   empagliflozin (JARDIANCE) 25 MG TABS tablet    Sig: Take 1 tablet (25 mg total) by mouth every morning.    Dispense:  90 tablet    Refill:  1    * * N O T I C E * * Last quantity doesn't match original quantity    Order Specific Question:   Supervising Provider    Answer:   Shelton Silvas   tirzepatide [facility]) 2.5 MG/0.5ML Pen    Sig: Inject 2.5 mg into the skin once a week.    Dispense:  2 mL    Refill:  0    Order Specific Question:   Supervising Provider    Answer:   COX, Lynder Parents   Continuous Blood Gluc Receiver (FREESTYLE LIBRE 14 DAY READER) DEVI    Sig: 1 each by Does not apply route daily.    Dispense:  1 each    Refill:  0    Order Specific Question:   Supervising Provider    Answer:   COX, Lynder Parents   Continuous Blood Gluc Sensor (FREESTYLE LIBRE 14 DAY SENSOR) MISC    Sig: 1 each by Does not apply route every 14 (fourteen) days.    Dispense:  2 each    Refill:  5    Order Specific Question:   Supervising Provider    AnswerShelton Silvas     Orders Placed This Encounter  Procedures   CBC with Differential/Platelet   Comprehensive metabolic panel   Lipid panel    Hemoglobin A1c     Follow-up: Return in about 4 months (around 02/11/2023) for chronic fasting follow up.  An After Visit Summary was printed and given to the patient.  Yetta Flock [facility] [phone]

## 2022-10-13 NOTE — Progress Notes (Deleted)
Established Patient Office Visit  Subjective:  Patient ID: William Massey, male    DOB: [birth year]  Age: 37 y.o. MRN: [id number]  CC:  Chief Complaint  Patient presents with   Diabetes   Hypertension    HPI William Massey presents for ***  Past Medical History:  Diagnosis Date   Asthma    Diabetes mellitus without complication ([place])    Generalized anxiety disorder    Hypertension    Mild intermittent asthma, uncomplicated    Mixed hyperlipidemia    Morbid (severe) obesity due to excess calories [facility])    Personal history of malignant neoplasm of testis     Past Surgical History:  Procedure Laterality Date   ORCHIECTOMY Left 02/19/2015   Procedure: RADICAL ORCHIECTOMY;  Surgeon: Cleon Gustin, MD;  Location: [facility];  Service: Urology;  Laterality: Left;    Family History  Problem Relation Age of Onset   Hypertension Father    Diabetes type II Father    Leukemia Brother     Social History   Socioeconomic History   Marital status: Single    Spouse name: Not on file   Number of children: 1   Years of education: Not on file   Highest education level: Not on file  Occupational History   Not on file  Tobacco Use   Smoking status: Never   Smokeless tobacco: Never  Vaping Use   Vaping Use: Never used  Substance and Sexual Activity   Alcohol use: Yes    Comment: Drinks alcohol very infrequently. He typically consumes beer   Drug use: Not Currently    Frequency: 0.2 times per week    Types: Marijuana   Sexual activity: Yes    Partners: Female  Other Topics Concern   Not on file  Social History Narrative   Not on file   Social Determinants of Health   Financial Resource Strain: Not on file  Food Insecurity: Not on file  Transportation Needs: Not on file  Physical Activity: Not on file  Stress: Not on file  Social Connections: Not on file  Intimate Partner Violence: Not on file     Current Outpatient Medications:    albuterol (VENTOLIN HFA)  108 (90 Base) MCG/ACT inhaler, Inhale 1-2 puffs into the lungs every 6 (six) hours as needed for wheezing or shortness of breath., Disp: , Rfl:    amLODipine (NORVASC) 10 MG tablet, TAKE 1 TABLET BY MOUTH DAILY, Disp: 90 tablet, Rfl: 0   atorvastatin (LIPITOR) 80 MG tablet, Take 1 tablet (80 mg total) by mouth daily., Disp: 90 tablet, Rfl: 0   fluticasone (FLONASE) 50 MCG/ACT nasal spray, Place 2 sprays into both nostrils daily., Disp: 16 g, Rfl: 6   hydrochlorothiazide (HYDRODIURIL) 25 MG tablet, TAKE 1 TABLET BY MOUTH DAILY, Disp: 100 tablet, Rfl: 0   JARDIANCE 25 MG TABS tablet, TAKE 1 TABLET BY MOUTH EVERY MORNING., Disp: 90 tablet, Rfl: 0   metoprolol (TOPROL-XL) 200 MG 24 hr tablet, TAKE 1 TABLET BY MOUTH DAILY, Disp: 90 tablet, Rfl: 0   Multiple Vitamins-Minerals (MULTIVITAMIN ADULTS PO), Take by mouth., Disp: , Rfl:    pioglitazone (ACTOS) 45 MG tablet, TAKE 1 TABLET BY MOUTH DAILY, Disp: 90 tablet, Rfl: 0   TRULICITY 3 0000000 SOPN, INJECT 3 MG AS DIRECTED ONCE A WEEK., Disp: 2 mL, Rfl: 4   valsartan (DIOVAN) 160 MG tablet, TAKE 1 TABLET BY MOUTH DAILY., Disp: 90 tablet, Rfl: 0   VASCEPA 1 g capsule, TAKE  2 CAPSULES BY MOUTH 2 TIMES DAILY., Disp: 120 capsule, Rfl: 2   Allergies  Allergen Reactions   Metformin And Related Nausea Only    ROS CONSTITUTIONAL: Negative for chills, fatigue, fever, unintentional weight gain and unintentional weight loss.  E/N/T: Negative for ear pain, nasal congestion and sore throat.  CARDIOVASCULAR: Negative for chest pain, dizziness, palpitations and pedal edema.  RESPIRATORY: Negative for recent cough and dyspnea.  GASTROINTESTINAL: Negative for abdominal pain, acid reflux symptoms, constipation, diarrhea, nausea and vomiting.  MSK: Negative for arthralgias and myalgias.  INTEGUMENTARY: Negative for rash.  NEUROLOGICAL: Negative for dizziness and headaches.  PSYCHIATRIC: Negative for sleep disturbance and to question depression screen.  Negative  for depression, negative for anhedonia.        Objective:    PHYSICAL EXAM:   VS: BP 128/78   Pulse 88   Temp 97.6 F (36.4 C)   Ht '6\' 2"'$  (1.88 m)   Wt (!) 485 lb (220 kg)   SpO2 99%   BMI 62.27 kg/m   GEN: Well nourished, well developed, in no acute distress  HEENT: normal external ears and nose - normal external auditory canals and TMS - hearing grossly normal - normal nasal mucosa and septum - Lips, Teeth and Gums - normal  Oropharynx - normal mucosa, palate, and posterior pharynx Neck: no JVD or masses - no thyromegaly Cardiac: RRR; no murmurs, rubs, or gallops,no edema - no significant varicosities Respiratory:  normal respiratory rate and pattern with no distress - normal breath sounds with no rales, rhonchi, wheezes or rubs GI: normal bowel sounds, no masses or tenderness MS: no deformity or atrophy  Skin: warm and dry, no rash  Neuro:  Alert and Oriented x 3, Strength and sensation are intact - CN II-Xii grossly intact Psych: euthymic mood, appropriate affect and demeanor  BP 128/78   Pulse 88   Temp 97.6 F (36.4 C)   Ht '6\' 2"'$  (1.88 m)   Wt (!) 485 lb (220 kg)   SpO2 99%   BMI 62.27 kg/m  Wt Readings from Last 3 Encounters:  10/13/22 (!) 485 lb (220 kg)  07/03/22 (!) 491 lb (222.7 kg)  03/01/22 (!) 491 lb (222.7 kg)     Health Maintenance Due  Topic Date Due   OPHTHALMOLOGY EXAM  Never done   DTaP/Tdap/Td (1 - Tdap) Never done    There are no preventive care reminders to display for this patient.  Lab Results  Component Value Date   TSH 3.570 07/03/2022   Lab Results  Component Value Date   WBC 6.4 07/03/2022   HGB 17.3 07/03/2022   HCT 51.9 (H) 07/03/2022   MCV 85 07/03/2022   PLT 198 07/03/2022   Lab Results  Component Value Date   NA 141 07/03/2022   K 4.3 07/03/2022   CO2 25 07/03/2022   GLUCOSE 164 (H) 07/03/2022   BUN 11 07/03/2022   CREATININE 0.73 (L) 07/03/2022   BILITOT 0.7 07/03/2022   ALKPHOS 75 07/03/2022   AST 16  07/03/2022   ALT 27 07/03/2022   PROT 6.9 07/03/2022   ALBUMIN 4.7 07/03/2022   CALCIUM 9.9 07/03/2022   ANIONGAP 7 07/20/2015   EGFR 119 07/03/2022   Lab Results  Component Value Date   CHOL 189 07/03/2022   Lab Results  Component Value Date   HDL 50 07/03/2022   Lab Results  Component Value Date   LDLCALC 99 07/03/2022   Lab Results  Component Value Date  TRIG 234 (H) 07/03/2022   Lab Results  Component Value Date   CHOLHDL 3.8 07/03/2022   Lab Results  Component Value Date   HGBA1C 7.9 (H) 07/03/2022      Assessment & Plan:   Problem List Items Addressed This Visit       Cardiovascular and Mediastinum   Hypertension associated with diabetes ([place])     Endocrine   Type 2 diabetes mellitus ([place])    No orders of the defined types were placed in this encounter.   Follow-up: No follow-ups on file.    SARA R Stephano Arrants, PA-C

## 2022-10-14 LAB — LIPID PANEL
Chol/HDL Ratio: 4 ratio (ref 0.0–5.0)
Cholesterol, Total: 164 mg/dL (ref 100–199)
HDL: 41 mg/dL (ref >39)
LDL Chol Calc (NIH): 90 mg/dL (ref 0–99)
Triglycerides: 193 mg/dL — ABNORMAL HIGH (ref 0–149)
VLDL Cholesterol Cal: 33 mg/dL (ref 5–40)

## 2022-10-14 LAB — COMPREHENSIVE METABOLIC PANEL
ALT: 27 IU/L (ref 0–44)
AST: 15 IU/L (ref 0–40)
Albumin/Globulin Ratio: 1.9 (ref 1.2–2.2)
Albumin: 4.5 g/dL (ref 4.1–5.1)
Alkaline Phosphatase: 77 IU/L (ref 44–121)
BUN/Creatinine Ratio: 18 (ref 9–20)
BUN: 14 mg/dL (ref 6–20)
Bilirubin Total: 0.6 mg/dL (ref 0.0–1.2)
CO2: 26 mmol/L (ref 20–29)
Calcium: 9.7 mg/dL (ref 8.7–10.2)
Chloride: 96 mmol/L (ref 96–106)
Creatinine, Ser: 0.77 mg/dL (ref 0.76–1.27)
Globulin, Total: 2.4 g/dL (ref 1.5–4.5)
Glucose: 251 mg/dL — ABNORMAL HIGH (ref 70–99)
Potassium: 4.3 mmol/L (ref 3.5–5.2)
Sodium: 139 mmol/L (ref 134–144)
Total Protein: 6.9 g/dL (ref 6.0–8.5)
eGFR: 118 mL/min/{1.73_m2} (ref >59)

## 2022-10-14 LAB — CBC WITH DIFFERENTIAL/PLATELET
Basophils Absolute: 0.1 10*3/uL (ref 0.0–0.2)
Basos: 1 %
EOS (ABSOLUTE): 0.1 10*3/uL (ref 0.0–0.4)
Eos: 2 %
Hematocrit: 51.8 % — ABNORMAL HIGH (ref 37.5–51.0)
Hemoglobin: 17.5 g/dL (ref 13.0–17.7)
Immature Grans (Abs): 0 10*3/uL (ref 0.0–0.1)
Immature Granulocytes: 0 %
Lymphocytes Absolute: 2 10*3/uL (ref 0.7–3.1)
Lymphs: 35 %
MCH: 28.8 pg (ref 26.6–33.0)
MCHC: 33.8 g/dL (ref 31.5–35.7)
MCV: 85 fL (ref 79–97)
Monocytes Absolute: 0.6 10*3/uL (ref 0.1–0.9)
Monocytes: 10 %
Neutrophils Absolute: 2.9 10*3/uL (ref 1.4–7.0)
Neutrophils: 52 %
Platelets: 183 10*3/uL (ref 150–450)
RBC: 6.07 x10E6/uL — ABNORMAL HIGH (ref 4.14–5.80)
RDW: 13.8 % (ref 11.6–15.4)
WBC: 5.6 10*3/uL (ref 3.4–10.8)

## 2022-10-14 LAB — CARDIOVASCULAR RISK ASSESSMENT

## 2022-10-14 LAB — HEMOGLOBIN A1C
Est. average glucose Bld gHb Est-mCnc: 243 mg/dL
Hgb A1c MFr Bld: 10.1 % — ABNORMAL HIGH (ref 4.8–5.6)

## 2022-10-18 ENCOUNTER — Other Ambulatory Visit: Payer: Self-pay | Admitting: Physician Assistant

## 2022-10-18 DIAGNOSIS — E1165 Type 2 diabetes mellitus with hyperglycemia: Secondary | ICD-10-CM

## 2022-10-18 MED ORDER — DEXCOM G7 SENSOR MISC: 5 refills | Status: AC

## 2022-12-05 ENCOUNTER — Other Ambulatory Visit: Payer: Self-pay | Admitting: Physician Assistant

## 2022-12-05 DIAGNOSIS — E1159 Type 2 diabetes mellitus with other circulatory complications: Secondary | ICD-10-CM

## 2023-01-11 ENCOUNTER — Other Ambulatory Visit: Payer: Self-pay | Admitting: Family Medicine

## 2023-01-11 ENCOUNTER — Other Ambulatory Visit: Payer: Self-pay | Admitting: Physician Assistant

## 2023-01-11 DIAGNOSIS — E1159 Type 2 diabetes mellitus with other circulatory complications: Secondary | ICD-10-CM

## 2023-01-31 ENCOUNTER — Other Ambulatory Visit: Payer: Self-pay | Admitting: Physician Assistant

## 2023-02-06 ENCOUNTER — Other Ambulatory Visit (HOSPITAL_COMMUNITY): Payer: Self-pay

## 2023-02-06 ENCOUNTER — Telehealth: Payer: Self-pay

## 2023-02-06 ENCOUNTER — Other Ambulatory Visit: Payer: Self-pay | Admitting: Physician Assistant

## 2023-02-06 DIAGNOSIS — E1165 Type 2 diabetes mellitus with hyperglycemia: Secondary | ICD-10-CM

## 2023-02-06 MED ORDER — TIRZEPATIDE 2.5 MG/0.5ML ~~LOC~~ SOAJ
2.5000 mg | SUBCUTANEOUS | 0 refills | Status: DC
  Filled 2023-02-06 – 2023-02-16 (×7): qty 2, 28d supply, fill #0

## 2023-02-06 NOTE — Telephone Encounter (Signed)
Patient called back stating that the [facility] has the mounjaro 2.5 mg and prefers it be sent there. Can you please send this in for patient?

## 2023-02-06 NOTE — Telephone Encounter (Signed)
done

## 2023-02-06 NOTE — Telephone Encounter (Signed)
Patient informed, told patient that he would need to be asking pharmacies for the 2.5 mg dose of the mounjaro and this would be starting dose for 3-4 weeks and if tolerating then would be moved up. Patient understood verbally and stated that he would call around and once he finds some he will call and let us know.

## 2023-02-06 NOTE — Telephone Encounter (Signed)
Patient called stating that he has found some Mounjaro at [facility] on [address]. He states that they only have the 10 mg dose. He is asking if it is possible for him to start with this dose because that is currently what they have, and if so can you send in the prescription? Patient has not been on any previous doses because he states this is the first time he has been able to find the medication. Please advise.

## 2023-02-07 ENCOUNTER — Telehealth: Payer: Self-pay

## 2023-02-07 ENCOUNTER — Other Ambulatory Visit (HOSPITAL_COMMUNITY): Payer: Self-pay

## 2023-02-07 NOTE — Telephone Encounter (Signed)
Prior authorization started for patient for the [facility] awaiting for approval or denial.

## 2023-02-08 ENCOUNTER — Other Ambulatory Visit (HOSPITAL_COMMUNITY): Payer: Self-pay

## 2023-02-14 ENCOUNTER — Other Ambulatory Visit (HOSPITAL_COMMUNITY): Payer: Self-pay

## 2023-02-14 NOTE — Telephone Encounter (Signed)
Patient called checking status on PA - he states that it still won't go thru at the pharmacy.  Inquiry sent to clinical staff.

## 2023-02-14 NOTE — Telephone Encounter (Signed)
Patient made aware PA for Greggory Keen was approved.

## 2023-02-15 ENCOUNTER — Other Ambulatory Visit: Payer: Self-pay

## 2023-02-16 ENCOUNTER — Ambulatory Visit: Payer: BC Managed Care – PPO | Admitting: Physician Assistant

## 2023-02-16 ENCOUNTER — Other Ambulatory Visit (HOSPITAL_COMMUNITY): Payer: Self-pay

## 2023-02-16 ENCOUNTER — Encounter: Payer: Self-pay | Admitting: Physician Assistant

## 2023-02-16 VITALS — BP 132/74 | HR 84 | Temp 96.8°F | Ht 74.0 in | Wt >= 6400 oz

## 2023-02-16 DIAGNOSIS — E1165 Type 2 diabetes mellitus with hyperglycemia: Secondary | ICD-10-CM | POA: Diagnosis not present

## 2023-02-16 DIAGNOSIS — E782 Mixed hyperlipidemia: Secondary | ICD-10-CM | POA: Diagnosis not present

## 2023-02-16 DIAGNOSIS — J4520 Mild intermittent asthma, uncomplicated: Secondary | ICD-10-CM

## 2023-02-16 DIAGNOSIS — E1159 Type 2 diabetes mellitus with other circulatory complications: Secondary | ICD-10-CM | POA: Diagnosis not present

## 2023-02-16 DIAGNOSIS — Z7984 Long term (current) use of oral hypoglycemic drugs: Secondary | ICD-10-CM

## 2023-02-16 DIAGNOSIS — I152 Hypertension secondary to endocrine disorders: Secondary | ICD-10-CM

## 2023-02-16 LAB — COMPREHENSIVE METABOLIC PANEL
ALT: 31 IU/L (ref 0–44)
AST: 18 IU/L (ref 0–40)
Albumin: 4.5 g/dL (ref 4.1–5.1)
Alkaline Phosphatase: 81 IU/L (ref 44–121)
BUN/Creatinine Ratio: 23 — ABNORMAL HIGH (ref 9–20)
BUN: 20 mg/dL (ref 6–20)
Bilirubin Total: 0.7 mg/dL (ref 0.0–1.2)
CO2: 27 mmol/L (ref 20–29)
Calcium: 9.9 mg/dL (ref 8.7–10.2)
Chloride: 96 mmol/L (ref 96–106)
Creatinine, Ser: 0.86 mg/dL (ref 0.76–1.27)
Globulin, Total: 2.5 g/dL (ref 1.5–4.5)
Glucose: 224 mg/dL — ABNORMAL HIGH (ref 70–99)
Potassium: 4.6 mmol/L (ref 3.5–5.2)
Sodium: 139 mmol/L (ref 134–144)
Total Protein: 7 g/dL (ref 6.0–8.5)
eGFR: 113 mL/min/{1.73_m2} (ref >59)

## 2023-02-16 LAB — CBC WITH DIFFERENTIAL/PLATELET
Basophils Absolute: 0 10*3/uL (ref 0.0–0.2)
Basos: 1 %
EOS (ABSOLUTE): 0.1 10*3/uL (ref 0.0–0.4)
Eos: 2 %
Hematocrit: 53 % — ABNORMAL HIGH (ref 37.5–51.0)
Hemoglobin: 17.1 g/dL (ref 13.0–17.7)
Immature Grans (Abs): 0 10*3/uL (ref 0.0–0.1)
Immature Granulocytes: 0 %
Lymphocytes Absolute: 1.9 10*3/uL (ref 0.7–3.1)
Lymphs: 33 %
MCH: 27.1 pg (ref 26.6–33.0)
MCHC: 32.3 g/dL (ref 31.5–35.7)
MCV: 84 fL (ref 79–97)
Monocytes Absolute: 0.5 10*3/uL (ref 0.1–0.9)
Monocytes: 9 %
Neutrophils Absolute: 3.2 10*3/uL (ref 1.4–7.0)
Neutrophils: 55 %
Platelets: 182 10*3/uL (ref 150–450)
RBC: 6.32 x10E6/uL — ABNORMAL HIGH (ref 4.14–5.80)
RDW: 13.7 % (ref 11.6–15.4)
WBC: 5.8 10*3/uL (ref 3.4–10.8)

## 2023-02-16 LAB — LIPID PANEL
Chol/HDL Ratio: 4 ratio (ref 0.0–5.0)
Cholesterol, Total: 175 mg/dL (ref 100–199)
HDL: 44 mg/dL (ref >39)
LDL Chol Calc (NIH): 91 mg/dL (ref 0–99)
Triglycerides: 238 mg/dL — ABNORMAL HIGH (ref 0–149)
VLDL Cholesterol Cal: 40 mg/dL (ref 5–40)

## 2023-02-16 LAB — HEMOGLOBIN A1C
Est. average glucose Bld gHb Est-mCnc: 272 mg/dL
Hgb A1c MFr Bld: 11.1 % — ABNORMAL HIGH (ref 4.8–5.6)

## 2023-02-16 NOTE — Progress Notes (Signed)
Subjective:  Patient ID: William Massey, male    DOB: 03-Jan-1986  Age: 37 y.o. MRN: [id number]  Chief Complaint  Patient presents with   Medical Management of Chronic Issues    Diabetes  Hypertension    Pt presents for follow up of hypertension.  The patient is tolerating the medication well without side effects. Compliance with treatment has been good; including taking medication as directed , maintains a healthy diet and regular exercise regimen , and following up as directed. Pt currently on diovan 160mg  qd HCTZ 25mg  qd norvasc 10mg  and toprol XL 200mg  qd - bp 132/74  Mixed hyperlipidemia  Pt presents with hyperlipidemia.  Compliance with treatment has beengood The patient is compliant with medications, maintains a low cholesterol diet , follows up as directed , and maintains an exercise regimen . The patient denies experiencing any hypercholesterolemia related symptoms. Currenlty taking lipitor 80mg  qd and vascepa  Pt with history of NIDDM.  He is currently taking jardiance 25mg  qd, actos 45mg  qd and supposed to be taking Mounjaro but has not been able to get filled --- there is rx sent to [facility] outpatient pharmacy and we do have PA done with insurance - he will talk to them today to start medication His glucose has been elevated 180-200s  Pt with mild intermittent asthma - uses albuterol prn  Current Outpatient Medications on File Prior to Visit  Medication Sig Dispense Refill   Continuous Blood Gluc Sensor (DEXCOM G7 SENSOR) MISC To use qd for diabetes control 3 each 5   albuterol (VENTOLIN HFA) 108 (90 Base) MCG/ACT inhaler Inhale 1-2 puffs into the lungs every 6 (six) hours as needed for wheezing or shortness of breath.     amLODipine (NORVASC) 10 MG tablet Take 1 tablet (10 mg total) by mouth daily. 90 tablet 1   atorvastatin (LIPITOR) 80 MG tablet TAKE 1 TABLET BY MOUTH DAILY. 90 tablet 0   Continuous Blood Gluc Receiver (FREESTYLE LIBRE 14 DAY READER) DEVI 1 each by  Does not apply route daily. 1 each 0   empagliflozin (JARDIANCE) 25 MG TABS tablet Take 1 tablet (25 mg total) by mouth every morning. 90 tablet 1   fluticasone (FLONASE) 50 MCG/ACT nasal spray Place 2 sprays into both nostrils daily. 16 g 6   hydrochlorothiazide (HYDRODIURIL) 25 MG tablet TAKE 1 TABLET BY MOUTH DAILY 100 tablet 0   metoprolol (TOPROL-XL) 200 MG 24 hr tablet TAKE 1 TABLET BY MOUTH DAILY 90 tablet 0   Multiple Vitamins-Minerals (MULTIVITAMIN ADULTS PO) Take by mouth.     pioglitazone (ACTOS) 45 MG tablet Take 1 tablet (45 mg total) by mouth daily. 90 tablet 1   tirzepatide (MOUNJARO) 2.5 MG/0.5ML Pen Inject 2.5 mg into the skin once a week. (Patient not taking: Reported on 02/16/2023) 2 mL 0   valsartan (DIOVAN) 160 MG tablet TAKE 1 TABLET BY MOUTH DAILY. 90 tablet 0   VASCEPA 1 g capsule TAKE 2 CAPSULES BY MOUTH 2 TIMES DAILY. 120 capsule 2   No current facility-administered medications on file prior to visit.   Past Medical History:  Diagnosis Date   Asthma    Diabetes mellitus without complication ([facility])    Generalized anxiety disorder    Hypertension    Mild intermittent asthma, uncomplicated    Mixed hyperlipidemia    Morbid (severe) obesity due to excess calories ([facility])    Personal history of malignant neoplasm of testis    Past Surgical History:  Procedure Laterality Date  ORCHIECTOMY Left 02/19/2015   Procedure: RADICAL ORCHIECTOMY;  Surgeon: Malen Gauze, MD;  Location: [facility];  Service: Urology;  Laterality: Left;    Family History  Problem Relation Age of Onset   Hypertension Father    Diabetes type II Father    Leukemia Brother    Social History   Socioeconomic History   Marital status: Single    Spouse name: Not on file   Number of children: 1   Years of education: Not on file   Highest education level: Not on file  Occupational History   Not on file  Tobacco Use   Smoking status: Never   Smokeless tobacco: Never  Vaping Use   Vaping  Use: Never used  Substance and Sexual Activity   Alcohol use: Yes    Comment: Drinks alcohol very infrequently. He typically consumes beer   Drug use: Not Currently    Frequency: 0.2 times per week    Types: Marijuana   Sexual activity: Yes    Partners: Female  Other Topics Concern   Not on file  Social History Narrative   Not on file   Social Determinants of Health   Financial Resource Strain: Not on file  Food Insecurity: Not on file  Transportation Needs: Not on file  Physical Activity: Not on file  Stress: Not on file  Social Connections: Not on file   CONSTITUTIONAL: Negative for chills, fatigue, fever, unintentional weight gain and unintentional weight loss.  E/N/T: Negative for ear pain, nasal congestion and sore throat.  CARDIOVASCULAR: Negative for chest pain, dizziness, palpitations and pedal edema.  RESPIRATORY: Negative for recent cough and dyspnea.  GASTROINTESTINAL: Negative for abdominal pain, acid reflux symptoms, constipation, diarrhea, nausea and vomiting.  MSK: Negative for arthralgias and myalgias.  INTEGUMENTARY: Negative for rash.  NEUROLOGICAL: Negative for dizziness and headaches.  PSYCHIATRIC: Negative for sleep disturbance and to question depression screen.  Negative for depression, negative for anhedonia.      Objective:  PHYSICAL EXAM:   VS: BP 132/74   Pulse 84   Temp (!) 96.8 F (36 C)   Ht 6\' 2"  (1.88 m)   Wt (!) 482 lb (218.6 kg)   SpO2 98%   BMI 61.89 kg/m   GEN: Well nourished, well developed, in no acute distress  Cardiac: RRR; no murmurs, rubs, or gallops,no edema - Respiratory:  normal respiratory rate and pattern with no distress - normal breath sounds with no rales, rhonchi, wheezes or rubs MS: no deformity or atrophy  Skin: warm and dry, no rash  Psych: euthymic mood, appropriate affect and demeanor   Lab Results  Component Value Date   WBC 5.6 10/13/2022   HGB 17.5 10/13/2022   HCT 51.8 (H) 10/13/2022   PLT 183  10/13/2022   GLUCOSE 251 (H) 10/13/2022   CHOL 164 10/13/2022   TRIG 193 (H) 10/13/2022   HDL 41 10/13/2022   LDLCALC 90 10/13/2022   ALT 27 10/13/2022   AST 15 10/13/2022   NA 139 10/13/2022   K 4.3 10/13/2022   CL 96 10/13/2022   CREATININE 0.77 10/13/2022   BUN 14 10/13/2022   CO2 26 10/13/2022   TSH 3.570 07/03/2022   HGBA1C 10.1 (H) 10/13/2022   MICROALBUR negative 11/27/2019      Assessment & Plan:   Problem List Items Addressed This Visit       Cardiovascular and Mediastinum   Hypertension associated with diabetes ([facility]) - Primary   Relevant Orders   CBC  with Differential/Platelet   Comprehensive metabolic panel   TSH Continue current meds     Endocrine   Type 2 diabetes mellitus ([facility])   Relevant Orders   CBC with Differential/Platelet   Comprehensive metabolic panel   Hemoglobin A1c Get mounjaro filled     Other   Mixed hyperlipidemia   Relevant Orders   Lipid panel Continue meds  Low fat/low chol diet  Mild intermittent asthma Use albuterol prn  .  No orders of the defined types were placed in this encounter.    Orders Placed This Encounter  Procedures   CBC with Differential/Platelet   Comprehensive metabolic panel   Lipid panel   Hemoglobin A1c     Follow-up: Return in about 4 months (around 06/18/2023) for chronic fasting follow-up.  An After Visit Summary was printed and given to the patient.  Jettie Pagan [facility] [phone]

## 2023-03-06 ENCOUNTER — Other Ambulatory Visit (HOSPITAL_COMMUNITY): Payer: Self-pay

## 2023-03-06 ENCOUNTER — Other Ambulatory Visit: Payer: Self-pay | Admitting: Physician Assistant

## 2023-03-06 ENCOUNTER — Other Ambulatory Visit: Payer: Self-pay

## 2023-03-06 DIAGNOSIS — E1165 Type 2 diabetes mellitus with hyperglycemia: Secondary | ICD-10-CM

## 2023-03-06 MED ORDER — TIRZEPATIDE 5 MG/0.5ML ~~LOC~~ SOAJ
5.0000 mg | SUBCUTANEOUS | 0 refills | Status: DC
Start: 2023-03-06 — End: 2023-04-10
  Filled 2023-03-06: qty 2, 28d supply, fill #0

## 2023-03-06 NOTE — Telephone Encounter (Signed)
Patient called and stated he will take his last injection of  mounjaro and is ready to go to the next dose. Please send to [facility] pharmacy.

## 2023-03-09 ENCOUNTER — Other Ambulatory Visit (HOSPITAL_COMMUNITY): Payer: Self-pay

## 2023-04-10 ENCOUNTER — Telehealth: Payer: Self-pay

## 2023-04-10 ENCOUNTER — Other Ambulatory Visit: Payer: Self-pay

## 2023-04-10 ENCOUNTER — Other Ambulatory Visit: Payer: Self-pay | Admitting: Physician Assistant

## 2023-04-10 ENCOUNTER — Other Ambulatory Visit (HOSPITAL_COMMUNITY): Payer: Self-pay

## 2023-04-10 DIAGNOSIS — E1165 Type 2 diabetes mellitus with hyperglycemia: Secondary | ICD-10-CM

## 2023-04-10 MED ORDER — TIRZEPATIDE 7.5 MG/0.5ML ~~LOC~~ SOAJ
7.5000 mg | SUBCUTANEOUS | 0 refills | Status: DC
Start: 2023-04-10 — End: 2023-06-20
  Filled 2023-04-10: qty 6, 84d supply, fill #0

## 2023-04-10 NOTE — Telephone Encounter (Signed)
Please call pt and if tolerating mounjaro 5mg  will go ahead and increase to 7.5mg  weekly

## 2023-04-10 NOTE — Telephone Encounter (Signed)
Prescription Request  04/10/2023  LOV: Visit date not found  What is the name of the medication or equipment?  tirzepatide [facility]) 5 MG/0.5ML Pen   Have you contacted your pharmacy to request a refill? No   Which pharmacy would you like this sent to?   [place] [facility] [address] [place] [address] Phone: [phone] Fax: [phone]    Patient notified that their request is being sent to the clinical staff for review and that they should receive a response within 2 business days.   Please advise at [facility] [phone]

## 2023-04-16 ENCOUNTER — Other Ambulatory Visit: Payer: Self-pay | Admitting: Physician Assistant

## 2023-04-16 DIAGNOSIS — E1159 Type 2 diabetes mellitus with other circulatory complications: Secondary | ICD-10-CM

## 2023-05-25 ENCOUNTER — Other Ambulatory Visit: Payer: Self-pay | Admitting: Physician Assistant

## 2023-05-25 DIAGNOSIS — E1159 Type 2 diabetes mellitus with other circulatory complications: Secondary | ICD-10-CM

## 2023-05-31 ENCOUNTER — Other Ambulatory Visit: Payer: Self-pay | Admitting: Physician Assistant

## 2023-06-20 ENCOUNTER — Ambulatory Visit: Payer: BC Managed Care – PPO | Admitting: Physician Assistant

## 2023-06-20 ENCOUNTER — Encounter: Payer: Self-pay | Admitting: Physician Assistant

## 2023-06-20 ENCOUNTER — Other Ambulatory Visit (HOSPITAL_COMMUNITY): Payer: Self-pay

## 2023-06-20 VITALS — BP 118/78 | HR 84 | Temp 97.6°F | Resp 16 | Ht 74.0 in | Wt >= 6400 oz

## 2023-06-20 DIAGNOSIS — E1159 Type 2 diabetes mellitus with other circulatory complications: Secondary | ICD-10-CM | POA: Diagnosis not present

## 2023-06-20 DIAGNOSIS — E1165 Type 2 diabetes mellitus with hyperglycemia: Secondary | ICD-10-CM

## 2023-06-20 DIAGNOSIS — I152 Hypertension secondary to endocrine disorders: Secondary | ICD-10-CM | POA: Diagnosis not present

## 2023-06-20 DIAGNOSIS — E1169 Type 2 diabetes mellitus with other specified complication: Secondary | ICD-10-CM | POA: Diagnosis not present

## 2023-06-20 DIAGNOSIS — J4520 Mild intermittent asthma, uncomplicated: Secondary | ICD-10-CM | POA: Insufficient documentation

## 2023-06-20 DIAGNOSIS — Z7985 Long-term (current) use of injectable non-insulin antidiabetic drugs: Secondary | ICD-10-CM

## 2023-06-20 DIAGNOSIS — E782 Mixed hyperlipidemia: Secondary | ICD-10-CM | POA: Diagnosis not present

## 2023-06-20 DIAGNOSIS — E785 Hyperlipidemia, unspecified: Secondary | ICD-10-CM

## 2023-06-20 MED ORDER — TIRZEPATIDE 10 MG/0.5ML ~~LOC~~ SOAJ
10.0000 mg | SUBCUTANEOUS | 0 refills | Status: DC
  Filled 2023-06-20 – 2023-06-22 (×2): qty 6, 84d supply, fill #0

## 2023-06-20 NOTE — Progress Notes (Addendum)
Subjective:  Patient ID: William Massey, male    DOB: [birth year]  Age: 37 y.o. MRN: [id number]  Chief Complaint  Patient presents with   Medical Management of Chronic Issues    Diabetes  Hypertension    Pt presents for follow up of hypertension.  The patient is tolerating the medication well without side effects. Compliance with treatment has been good; including taking medication as directed , maintains a healthy diet and regular exercise regimen , and following up as directed. Pt currently on diovan 160mg  qd HCTZ 25mg  qd norvasc 10mg  and toprol XL 200mg  qd - bp 118/78  Mixed hyperlipidemia  Pt presents with hyperlipidemia.  Compliance with treatment has beengood The patient is compliant with medications, maintains a low cholesterol diet , follows up as directed , and maintains an exercise regimen . The patient denies experiencing any hypercholesterolemia related symptoms. Currenlty taking lipitor 80mg  qd and vascepa  Pt with history of NIDDM.  He is currently taking jardiance 25mg  qd, actos 45mg  qd and has started [facility] and up to the 7.5mg  dose - he states he is tolerating this medication ok - pharmacy actually sent a 3 month supply so the dose has not been titrated up but he prefers to get 3 months because of cost and tolerance Recommend to go ahead and increase to the 10mg  weekly dosing and will send in rx He states his portion sizes have significantly decreased Still having some glucose readings over 300 with high carb meals He has lost over 20 pounds since last visit  Pt with mild intermittent asthma - uses albuterol prn  Current Outpatient Medications on File Prior to Visit  Medication Sig Dispense Refill   albuterol (VENTOLIN HFA) 108 (90 Base) MCG/ACT inhaler Inhale 1-2 puffs into the lungs every 6 (six) hours as needed for wheezing or shortness of breath.     amLODipine (NORVASC) 10 MG tablet Take 1 tablet (10 mg total) by mouth daily. 90 tablet 1   atorvastatin  (LIPITOR) 80 MG tablet TAKE 1 TABLET BY MOUTH DAILY. 90 tablet 0   Continuous Blood Gluc Receiver (FREESTYLE LIBRE 14 DAY READER) DEVI 1 each by Does not apply route daily. 1 each 0   Continuous Blood Gluc Sensor (DEXCOM G7 SENSOR) MISC To use qd for diabetes control 3 each 5   empagliflozin (JARDIANCE) 25 MG TABS tablet Take 1 tablet (25 mg total) by mouth every morning. 90 tablet 1   fluticasone (FLONASE) 50 MCG/ACT nasal spray Place 2 sprays into both nostrils daily. 16 g 6   hydrochlorothiazide (HYDRODIURIL) 25 MG tablet TAKE 1 TABLET BY MOUTH DAILY 100 tablet 0   metoprolol (TOPROL-XL) 200 MG 24 hr tablet TAKE 1 TABLET BY MOUTH DAILY 90 tablet 0   Multiple Vitamins-Minerals (MULTIVITAMIN ADULTS PO) Take by mouth.     pioglitazone (ACTOS) 45 MG tablet Take 1 tablet (45 mg total) by mouth daily. 90 tablet 1   valsartan (DIOVAN) 160 MG tablet TAKE 1 TABLET BY MOUTH DAILY. 90 tablet 0   VASCEPA 1 g capsule TAKE 2 CAPSULES BY MOUTH 2 TIMES DAILY. 120 capsule 1   No current facility-administered medications on file prior to visit.   Past Medical History:  Diagnosis Date   Asthma    Diabetes mellitus without complication (HCC)    Generalized anxiety disorder    Hypertension    Mild intermittent asthma, uncomplicated    Mixed hyperlipidemia    Morbid (severe) obesity due to excess calories (HCC)  Personal history of malignant neoplasm of testis    Past Surgical History:  Procedure Laterality Date   ORCHIECTOMY Left 02/19/2015   Procedure: RADICAL ORCHIECTOMY;  Surgeon: Malen Gauze, MD;  Location: [facility];  Service: Urology;  Laterality: Left;    Family History  Problem Relation Age of Onset   Hypertension Father    Diabetes type II Father    Leukemia Brother    Social History   Socioeconomic History   Marital status: Single    Spouse name: Not on file   Number of children: 1   Years of education: Not on file   Highest education level: Not on file  Occupational History    Not on file  Tobacco Use   Smoking status: Never   Smokeless tobacco: Never  Vaping Use   Vaping status: Never Used  Substance and Sexual Activity   Alcohol use: Yes    Comment: Drinks alcohol very infrequently. He typically consumes beer   Drug use: Not Currently    Frequency: 0.2 times per week    Types: Marijuana   Sexual activity: Yes    Partners: Female  Other Topics Concern   Not on file  Social History Narrative   Not on file   Social Determinants of Health   Financial Resource Strain: Not on file  Food Insecurity: Not on file  Transportation Needs: Not on file  Physical Activity: Not on file  Stress: Not on file  Social Connections: Not on file   CONSTITUTIONAL: Negative for chills, fatigue, fever, unintentional weight gain and unintentional weight loss.  E/N/T: Negative for ear pain, nasal congestion and sore throat.  CARDIOVASCULAR: Negative for chest pain, dizziness, palpitations and pedal edema.  RESPIRATORY: Negative for recent cough and dyspnea.  GASTROINTESTINAL: Negative for abdominal pain, acid reflux symptoms, constipation, diarrhea, nausea and vomiting.  MSK: Negative for arthralgias and myalgias.  INTEGUMENTARY: Negative for rash.  NEUROLOGICAL: Negative for dizziness and headaches.  PSYCHIATRIC: Negative for sleep disturbance and to question depression screen.  Negative for depression, negative for anhedonia.          Objective:  PHYSICAL EXAM:   VS: BP 118/78 (BP Location: Left Arm, Patient Position: Sitting, Cuff Size: Large)   Pulse 84   Temp 97.6 F (36.4 C) (Temporal)   Resp 16   Ht 6\' 2"  (1.88 m)   Wt (!) 460 lb 9.6 oz (208.9 kg)   SpO2 95%   BMI 59.14 kg/m   GEN: Well nourished, well developed, in no acute distress   Cardiac: RRR; no murmurs, rubs, or gallops,no edema - no significant varicosities Respiratory:  normal respiratory rate and pattern with no distress - normal breath sounds with no rales, rhonchi, wheezes or  rubs  MS: no deformity or atrophy  Skin: warm and dry, no rash  Neuro:  Alert and Oriented x 3- CN II-Xii grossly intact Psych: euthymic mood, appropriate affect and demeanor  Diabetic Foot Exam - Simple   Simple Foot Form Diabetic Foot exam was performed with the following findings: Yes 06/20/2023  9:04 AM  Visual Inspection No deformities, no ulcerations, no other skin breakdown bilaterally: Yes Sensation Testing Intact to touch and monofilament testing bilaterally: Yes Pulse Check Posterior Tibialis and Dorsalis pulse intact bilaterally: Yes Comments      Lab Results  Component Value Date   WBC 5.8 02/16/2023   HGB 17.1 02/16/2023   HCT 53.0 (H) 02/16/2023   PLT 182 02/16/2023   GLUCOSE 224 (H) 02/16/2023  CHOL 175 02/16/2023   TRIG 238 (H) 02/16/2023   HDL 44 02/16/2023   LDLCALC 91 02/16/2023   ALT 31 02/16/2023   AST 18 02/16/2023   NA 139 02/16/2023   K 4.6 02/16/2023   CL 96 02/16/2023   CREATININE 0.86 02/16/2023   BUN 20 02/16/2023   CO2 27 02/16/2023   TSH 3.570 07/03/2022   HGBA1C 11.1 (H) 02/16/2023   MICROALBUR negative 11/27/2019      Assessment & Plan:   Problem List Items Addressed This Visit       Cardiovascular and Mediastinum   Hypertension associated with diabetes (HCC) - Primary   Relevant Orders   CBC with Differential/Platelet   Comprehensive metabolic panel   TSH Continue current meds     Endocrine   Type 2 diabetes mellitus with hyperglycemia(HCC)   Relevant Orders   CBC with Differential/Platelet   Comprehensive metabolic panel   Hemoglobin A1c Continue meds and increase mounjaro to 10mg  weekly     Other   Hyperlipidemia associated with diabetes (HCC)    Relevant Orders   Lipid panel Continue meds  Low fat/low chol diet  Mild intermittent asthma Use albuterol prn  Morbid obesity (HCC) Continue meds/watch diet/efforts at weight loss  .  Meds ordered this encounter  Medications   tirzepatide (MOUNJARO) 10  MG/0.5ML Pen    Sig: Inject 10 mg into the skin once a week.    Dispense:  6 mL    Refill:  0    Order Specific Question:   Supervising Provider    AnswerCorey Harold     Orders Placed This Encounter  Procedures   CBC with Differential/Platelet   Comprehensive metabolic panel   Hemoglobin A1c   Lipid panel   TSH     Follow-up: Return in about 4 months (around 10/19/2023) for chronic fasting follow-up.  An After Visit Summary was printed and given to the patient.  Jettie Pagan [facility] [phone]

## 2023-06-21 LAB — COMPREHENSIVE METABOLIC PANEL
ALT: 15 [IU]/L (ref 0–44)
AST: 13 [IU]/L (ref 0–40)
Albumin: 4.4 g/dL (ref 4.1–5.1)
Alkaline Phosphatase: 65 [IU]/L (ref 44–121)
BUN/Creatinine Ratio: 16 (ref 9–20)
BUN: 14 mg/dL (ref 6–20)
Bilirubin Total: 0.8 mg/dL (ref 0.0–1.2)
CO2: 25 mmol/L (ref 20–29)
Calcium: 9.8 mg/dL (ref 8.7–10.2)
Chloride: 99 mmol/L (ref 96–106)
Creatinine, Ser: 0.86 mg/dL (ref 0.76–1.27)
Globulin, Total: 2.3 g/dL (ref 1.5–4.5)
Glucose: 120 mg/dL — ABNORMAL HIGH (ref 70–99)
Potassium: 4.6 mmol/L (ref 3.5–5.2)
Sodium: 139 mmol/L (ref 134–144)
Total Protein: 6.7 g/dL (ref 6.0–8.5)
eGFR: 113 mL/min/{1.73_m2} (ref >59)

## 2023-06-21 LAB — CBC WITH DIFFERENTIAL/PLATELET
Basophils Absolute: 0 10*3/uL (ref 0.0–0.2)
Basos: 1 %
EOS (ABSOLUTE): 0.1 10*3/uL (ref 0.0–0.4)
Eos: 1 %
Hematocrit: 51.6 % — ABNORMAL HIGH (ref 37.5–51.0)
Hemoglobin: 16.8 g/dL (ref 13.0–17.7)
Immature Grans (Abs): 0 10*3/uL (ref 0.0–0.1)
Immature Granulocytes: 0 %
Lymphocytes Absolute: 1.9 10*3/uL (ref 0.7–3.1)
Lymphs: 33 %
MCH: 28 pg (ref 26.6–33.0)
MCHC: 32.6 g/dL (ref 31.5–35.7)
MCV: 86 fL (ref 79–97)
Monocytes Absolute: 0.6 10*3/uL (ref 0.1–0.9)
Monocytes: 10 %
Neutrophils Absolute: 3.3 10*3/uL (ref 1.4–7.0)
Neutrophils: 55 %
Platelets: 201 10*3/uL (ref 150–450)
RBC: 6.01 x10E6/uL — ABNORMAL HIGH (ref 4.14–5.80)
RDW: 14.5 % (ref 11.6–15.4)
WBC: 5.9 10*3/uL (ref 3.4–10.8)

## 2023-06-21 LAB — LIPID PANEL
Chol/HDL Ratio: 3.2 ratio (ref 0.0–5.0)
Cholesterol, Total: 140 mg/dL (ref 100–199)
HDL: 44 mg/dL (ref >39)
LDL Chol Calc (NIH): 73 mg/dL (ref 0–99)
Triglycerides: 130 mg/dL (ref 0–149)
VLDL Cholesterol Cal: 23 mg/dL (ref 5–40)

## 2023-06-21 LAB — HEMOGLOBIN A1C
Est. average glucose Bld gHb Est-mCnc: 171 mg/dL
Hgb A1c MFr Bld: 7.6 % — ABNORMAL HIGH (ref 4.8–5.6)

## 2023-06-21 LAB — TSH: TSH: 2.44 u[IU]/mL (ref 0.450–4.500)

## 2023-06-22 ENCOUNTER — Other Ambulatory Visit (HOSPITAL_BASED_OUTPATIENT_CLINIC_OR_DEPARTMENT_OTHER): Payer: Self-pay

## 2023-06-22 ENCOUNTER — Other Ambulatory Visit: Payer: Self-pay

## 2023-06-22 ENCOUNTER — Other Ambulatory Visit (HOSPITAL_COMMUNITY): Payer: Self-pay

## 2023-06-25 ENCOUNTER — Other Ambulatory Visit (HOSPITAL_COMMUNITY): Payer: Self-pay

## 2023-06-26 ENCOUNTER — Other Ambulatory Visit (HOSPITAL_COMMUNITY): Payer: Self-pay

## 2023-06-28 ENCOUNTER — Other Ambulatory Visit: Payer: Self-pay

## 2023-07-16 ENCOUNTER — Other Ambulatory Visit: Payer: Self-pay | Admitting: Physician Assistant

## 2023-07-16 DIAGNOSIS — E1159 Type 2 diabetes mellitus with other circulatory complications: Secondary | ICD-10-CM

## 2023-07-16 DIAGNOSIS — E1165 Type 2 diabetes mellitus with hyperglycemia: Secondary | ICD-10-CM

## 2023-09-17 ENCOUNTER — Other Ambulatory Visit: Payer: Self-pay | Admitting: Physician Assistant

## 2023-09-17 DIAGNOSIS — I152 Hypertension secondary to endocrine disorders: Secondary | ICD-10-CM

## 2023-09-18 ENCOUNTER — Other Ambulatory Visit: Payer: Self-pay | Admitting: Physician Assistant

## 2023-09-18 ENCOUNTER — Other Ambulatory Visit (HOSPITAL_BASED_OUTPATIENT_CLINIC_OR_DEPARTMENT_OTHER): Payer: Self-pay

## 2023-09-18 DIAGNOSIS — E1165 Type 2 diabetes mellitus with hyperglycemia: Secondary | ICD-10-CM

## 2023-09-18 MED ORDER — TIRZEPATIDE 12.5 MG/0.5ML ~~LOC~~ SOAJ
12.5000 mg | SUBCUTANEOUS | 0 refills | Status: DC
  Filled 2023-09-18: qty 6, 84d supply, fill #0

## 2023-09-18 NOTE — Telephone Encounter (Signed)
Copied from CRM [id number]. Topic: Clinical - Medication Refill >> Sep 18, 2023  9:41 AM Eunice Blase wrote: Most Recent Primary Care Visit:  Provider: Marianne Sofia  Department: [facility]  Visit Type: OFFICE VISIT  Date: 06/20/2023  Medication: tirzepatide [facility]) 10 MG/0.5ML Pen  Has the patient contacted their pharmacy? Yes (Agent: If no, request that the patient contact the pharmacy for the refill. If patient does not wish to contact the pharmacy document the reason why and proceed with request.) (Agent: If yes, when and what did the pharmacy advise?)  Is this the correct pharmacy for this prescription? Yes If no, delete pharmacy and type the correct one.  This is the patient's preferred pharmacy:     [place] [facility] [address] [place] [address] Phone: [phone] Fax: [phone]   Has the prescription been filled recently? Yes  Is the patient out of the medication? Yes  Has the patient been seen for an appointment in the last year OR does the patient have an upcoming appointment? Yes  Can we respond through MyChart? Yes  Agent: Please be advised that Rx refills may take up to 3 business days. We ask that you follow-up with your pharmacy.

## 2023-09-18 NOTE — Telephone Encounter (Signed)
Call pt and see if tolerating mounjaro 10mg  --- what are sugars running? If doing well and not getting significantly low readings would he like to move up to the next dose?

## 2023-09-19 ENCOUNTER — Other Ambulatory Visit (HOSPITAL_BASED_OUTPATIENT_CLINIC_OR_DEPARTMENT_OTHER): Payer: Self-pay

## 2023-09-20 ENCOUNTER — Other Ambulatory Visit (HOSPITAL_BASED_OUTPATIENT_CLINIC_OR_DEPARTMENT_OTHER): Payer: Self-pay

## 2023-10-19 ENCOUNTER — Other Ambulatory Visit: Payer: Self-pay | Admitting: Physician Assistant

## 2023-10-19 DIAGNOSIS — E1159 Type 2 diabetes mellitus with other circulatory complications: Secondary | ICD-10-CM

## 2023-10-19 DIAGNOSIS — E1165 Type 2 diabetes mellitus with hyperglycemia: Secondary | ICD-10-CM

## 2023-10-23 ENCOUNTER — Other Ambulatory Visit: Payer: Self-pay | Admitting: Physician Assistant

## 2023-10-23 DIAGNOSIS — I152 Hypertension secondary to endocrine disorders: Secondary | ICD-10-CM

## 2023-10-25 ENCOUNTER — Ambulatory Visit: Payer: BC Managed Care – PPO | Admitting: Physician Assistant

## 2023-10-31 ENCOUNTER — Encounter: Payer: Self-pay | Admitting: Physician Assistant

## 2023-10-31 ENCOUNTER — Ambulatory Visit: Admitting: Physician Assistant

## 2023-10-31 VITALS — BP 128/84 | HR 83 | Temp 97.6°F | Resp 18 | Ht 74.0 in | Wt >= 6400 oz

## 2023-10-31 DIAGNOSIS — E1159 Type 2 diabetes mellitus with other circulatory complications: Secondary | ICD-10-CM

## 2023-10-31 DIAGNOSIS — E1169 Type 2 diabetes mellitus with other specified complication: Secondary | ICD-10-CM | POA: Diagnosis not present

## 2023-10-31 DIAGNOSIS — I152 Hypertension secondary to endocrine disorders: Secondary | ICD-10-CM | POA: Diagnosis not present

## 2023-10-31 DIAGNOSIS — E785 Hyperlipidemia, unspecified: Secondary | ICD-10-CM

## 2023-10-31 DIAGNOSIS — E1165 Type 2 diabetes mellitus with hyperglycemia: Secondary | ICD-10-CM

## 2023-10-31 LAB — CBC WITH DIFFERENTIAL/PLATELET
Basophils Absolute: 0 10*3/uL (ref 0.0–0.2)
Basos: 1 %
EOS (ABSOLUTE): 0.1 10*3/uL (ref 0.0–0.4)
Eos: 1 %
Hematocrit: 51.3 % — ABNORMAL HIGH (ref 37.5–51.0)
Hemoglobin: 17.1 g/dL (ref 13.0–17.7)
Immature Grans (Abs): 0 10*3/uL (ref 0.0–0.1)
Immature Granulocytes: 0 %
Lymphocytes Absolute: 2.3 10*3/uL (ref 0.7–3.1)
Lymphs: 36 %
MCH: 28.6 pg (ref 26.6–33.0)
MCHC: 33.3 g/dL (ref 31.5–35.7)
MCV: 86 fL (ref 79–97)
Monocytes Absolute: 0.7 10*3/uL (ref 0.1–0.9)
Monocytes: 10 %
Neutrophils Absolute: 3.3 10*3/uL (ref 1.4–7.0)
Neutrophils: 52 %
Platelets: 205 10*3/uL (ref 150–450)
RBC: 5.97 x10E6/uL — ABNORMAL HIGH (ref 4.14–5.80)
RDW: 14.4 % (ref 11.6–15.4)
WBC: 6.4 10*3/uL (ref 3.4–10.8)

## 2023-10-31 LAB — COMPREHENSIVE METABOLIC PANEL
ALT: 19 IU/L (ref 0–44)
AST: 20 IU/L (ref 0–40)
Albumin: 4.4 g/dL (ref 4.1–5.1)
Alkaline Phosphatase: 68 IU/L (ref 44–121)
BUN/Creatinine Ratio: 17 (ref 9–20)
BUN: 13 mg/dL (ref 6–20)
Bilirubin Total: 0.8 mg/dL (ref 0.0–1.2)
CO2: 24 mmol/L (ref 20–29)
Calcium: 9.3 mg/dL (ref 8.7–10.2)
Chloride: 100 mmol/L (ref 96–106)
Creatinine, Ser: 0.77 mg/dL (ref 0.76–1.27)
Globulin, Total: 2.2 g/dL (ref 1.5–4.5)
Glucose: 120 mg/dL — ABNORMAL HIGH (ref 70–99)
Potassium: 4.4 mmol/L (ref 3.5–5.2)
Sodium: 140 mmol/L (ref 134–144)
Total Protein: 6.6 g/dL (ref 6.0–8.5)
eGFR: 117 mL/min/{1.73_m2} (ref >59)

## 2023-10-31 LAB — HEMOGLOBIN A1C
Est. average glucose Bld gHb Est-mCnc: 140 mg/dL
Hgb A1c MFr Bld: 6.5 % — ABNORMAL HIGH (ref 4.8–5.6)

## 2023-10-31 LAB — LIPID PANEL
Chol/HDL Ratio: 3.6 ratio (ref 0.0–5.0)
Cholesterol, Total: 160 mg/dL (ref 100–199)
HDL: 45 mg/dL (ref >39)
LDL Chol Calc (NIH): 83 mg/dL (ref 0–99)
Triglycerides: 189 mg/dL — ABNORMAL HIGH (ref 0–149)
VLDL Cholesterol Cal: 32 mg/dL (ref 5–40)

## 2023-10-31 MED ORDER — ATORVASTATIN CALCIUM 80 MG PO TABS: 80.0000 mg | ORAL_TABLET | Freq: Every day | ORAL | 1 refills | Status: DC

## 2023-10-31 NOTE — Progress Notes (Signed)
 Subjective:  Patient ID: William Massey, male    DOB: [birth year]  Age: 38 y.o. MRN: [id number]  Chief Complaint  Patient presents with   Medical Management of Chronic Issues    Diabetes  Hypertension    Pt presents for follow up of hypertension.  The patient is tolerating the medication well without side effects. Compliance with treatment has been good; including taking medication as directed , maintains a healthy diet and regular exercise regimen , and following up as directed. Pt currently on diovan 160mg  qd HCTZ 25mg  qd norvasc 10mg  and toprol XL 200mg  qd - bp 128/84  Mixed hyperlipidemia  Pt presents with hyperlipidemia.  Compliance with treatment has beengood The patient is compliant with medications, maintains a low cholesterol diet , follows up as directed , and maintains an exercise regimen . The patient denies experiencing any hypercholesterolemia related symptoms. Currenlty taking lipitor 80mg  qd and vascepa  Pt with history of NIDDM.  He is currently taking jardiance 25mg  qd, actos 45mg  qd and Mounjaro 12.5mg  - he has been getting 3 month supply of that medication and still has another month to go before he would like dose increased Mostly glucose ranging 120s-150s but has had some low readings but only if not eating  Pt with mild intermittent asthma - uses albuterol prn  Current Outpatient Medications on File Prior to Visit  Medication Sig Dispense Refill   albuterol (VENTOLIN HFA) 108 (90 Base) MCG/ACT inhaler Inhale 1-2 puffs into the lungs every 6 (six) hours as needed for wheezing or shortness of breath.     amLODipine (NORVASC) 10 MG tablet TAKE 1 TABLET BY MOUTH DAILY. 90 tablet 0   Continuous Blood Gluc Receiver (FREESTYLE LIBRE 14 DAY READER) DEVI 1 each by Does not apply route daily. 1 each 0   Continuous Blood Gluc Sensor (DEXCOM G7 SENSOR) MISC To use qd for diabetes control 3 each 5   fluticasone (FLONASE) 50 MCG/ACT nasal spray Place 2 sprays into both  nostrils daily. 16 g 6   hydrochlorothiazide (HYDRODIURIL) 25 MG tablet TAKE 1 TABLET BY MOUTH DAILY 100 tablet 0   JARDIANCE 25 MG TABS tablet TAKE 1 TABLET BY MOUTH EVERY MORNING. 90 tablet 0   metoprolol (TOPROL-XL) 200 MG 24 hr tablet TAKE 1 TABLET BY MOUTH DAILY 90 tablet 0   Multiple Vitamins-Minerals (MULTIVITAMIN ADULTS PO) Take by mouth.     pioglitazone (ACTOS) 45 MG tablet TAKE 1 TABLET BY MOUTH DAILY. 90 tablet 0   tirzepatide (MOUNJARO) 12.5 MG/0.5ML Pen Inject 12.5 mg into the skin once a week. 6 mL 0   valsartan (DIOVAN) 160 MG tablet TAKE 1 TABLET BY MOUTH DAILY. 90 tablet 0   VASCEPA 1 g capsule TAKE 2 CAPSULES BY MOUTH 2 TIMES DAILY. 120 capsule 1   No current facility-administered medications on file prior to visit.   Past Medical History:  Diagnosis Date   Asthma    Diabetes mellitus without complication (HCC)    Generalized anxiety disorder    Hypertension    Mild intermittent asthma, uncomplicated    Mixed hyperlipidemia    Morbid (severe) obesity due to excess calories (HCC)    Personal history of malignant neoplasm of testis    Past Surgical History:  Procedure Laterality Date   ORCHIECTOMY Left 02/19/2015   Procedure: RADICAL ORCHIECTOMY;  Surgeon: Malen Gauze, MD;  Location: [facility];  Service: Urology;  Laterality: Left;    Family History  Problem Relation Age of Onset  Hypertension Father    Diabetes type II Father    Leukemia Brother    Social History   Socioeconomic History   Marital status: Single    Spouse name: Not on file   Number of children: 1   Years of education: Not on file   Highest education level: Not on file  Occupational History   Not on file  Tobacco Use   Smoking status: Never   Smokeless tobacco: Never  Vaping Use   Vaping status: Never Used  Substance and Sexual Activity   Alcohol use: Yes    Comment: Drinks alcohol very infrequently. He typically consumes beer   Drug use: Not Currently    Frequency: 0.2 times  per week    Types: Marijuana   Sexual activity: Yes    Partners: Female  Other Topics Concern   Not on file  Social History Narrative   Not on file   Social Drivers of Health   Financial Resource Strain: Not on file  Food Insecurity: Not on file  Transportation Needs: Not on file  Physical Activity: Not on file  Stress: Not on file  Social Connections: Not on file   CONSTITUTIONAL: Negative for chills, fatigue, fever, unintentional weight gain and unintentional weight loss.  E/N/T: Negative for ear pain, nasal congestion and sore throat.  CARDIOVASCULAR: Negative for chest pain, dizziness, palpitations and pedal edema.  RESPIRATORY: Negative for recent cough and dyspnea.  GASTROINTESTINAL: Negative for abdominal pain, acid reflux symptoms, constipation, diarrhea, nausea and vomiting.  MSK: Negative for arthralgias and myalgias.  INTEGUMENTARY: Negative for rash.  NEUROLOGICAL: Negative for dizziness and headaches.  PSYCHIATRIC: Negative for sleep disturbance and to question depression screen.  Negative for depression, negative for anhedonia.          Objective:  PHYSICAL EXAM:   VS: BP 128/84 (BP Location: Left Arm, Patient Position: Sitting, Cuff Size: Large)   Pulse 83   Temp 97.6 F (36.4 C) (Temporal)   Resp 18   Ht 6\' 2"  (1.88 m)   Wt (!) 460 lb (208.7 kg)   SpO2 94%   BMI 59.06 kg/m   GEN: Well nourished, well developed, in no acute distress   Cardiac: RRR; no murmurs, rubs, or gallops,no edema -  Respiratory:  normal respiratory rate and pattern with no distress - normal breath sounds with no rales, rhonchi, wheezes or rubs  MS: no deformity or atrophy  Skin: warm and dry, no rash  Neuro:  Alert and Oriented x 3, - CN II-Xii grossly intact Psych: euthymic mood, appropriate affect and demeanor   Diabetic Foot Exam - Simple   No data filed      Lab Results  Component Value Date   WBC 5.9 06/20/2023   HGB 16.8 06/20/2023   HCT 51.6 (H) 06/20/2023    PLT 201 06/20/2023   GLUCOSE 120 (H) 06/20/2023   CHOL 140 06/20/2023   TRIG 130 06/20/2023   HDL 44 06/20/2023   LDLCALC 73 06/20/2023   ALT 15 06/20/2023   AST 13 06/20/2023   NA 139 06/20/2023   K 4.6 06/20/2023   CL 99 06/20/2023   CREATININE 0.86 06/20/2023   BUN 14 06/20/2023   CO2 25 06/20/2023   TSH 2.440 06/20/2023   HGBA1C 7.6 (H) 06/20/2023   MICROALBUR negative 11/27/2019      Assessment & Plan:   Problem List Items Addressed This Visit       Cardiovascular and Mediastinum   Hypertension associated with diabetes (  HCC) - Primary   Relevant Orders   CBC with Differential/Platelet   Comprehensive metabolic panel   TSH Continue current meds     Endocrine   Type 2 diabetes mellitus with hyperglycemia(HCC)   Relevant Orders   CBC with Differential/Platelet   Comprehensive metabolic panel   Hemoglobin A1c Continue meds and will increase mounjaro to 15mg  weekly when he runs out of 12.5mg  dose     Other   Hyperlipidemia associated with diabetes (HCC)    Relevant Orders   Lipid panel Continue meds  Low fat/low chol diet  Mild intermittent asthma Use albuterol prn  Morbid obesity (HCC) Continue meds/watch diet/efforts at weight loss  .  Meds ordered this encounter  Medications   atorvastatin (LIPITOR) 80 MG tablet    Sig: Take 1 tablet (80 mg total) by mouth daily.    Dispense:  90 tablet    Refill:  1    Supervising Provider:   Blane Ohara [phone]     Orders Placed This Encounter  Procedures   CBC with Differential/Platelet   Comprehensive metabolic panel   Hemoglobin A1c   Lipid panel   Microalbumin / creatinine urine ratio     Follow-up: Return in about 4 months (around 03/01/2024) for chronic fasting follow-up.  An After Visit Summary was printed and given to the patient.  Jettie Pagan [facility] [phone]

## 2023-11-01 ENCOUNTER — Encounter: Payer: Self-pay | Admitting: Physician Assistant

## 2023-11-01 LAB — MICROALBUMIN / CREATININE URINE RATIO
Creatinine, Urine: 157.7 mg/dL
Microalb/Creat Ratio: 3 mg/g{creat} (ref 0–29)
Microalbumin, Urine: 4.5 ug/mL

## 2023-12-10 ENCOUNTER — Other Ambulatory Visit: Payer: Self-pay | Admitting: Physician Assistant

## 2023-12-10 ENCOUNTER — Other Ambulatory Visit (HOSPITAL_BASED_OUTPATIENT_CLINIC_OR_DEPARTMENT_OTHER): Payer: Self-pay

## 2023-12-10 DIAGNOSIS — E1165 Type 2 diabetes mellitus with hyperglycemia: Secondary | ICD-10-CM

## 2023-12-10 MED ORDER — TIRZEPATIDE 15 MG/0.5ML ~~LOC~~ SOAJ
15.0000 mg | SUBCUTANEOUS | 1 refills | Status: DC
  Filled 2023-12-10: qty 6, 84d supply, fill #0

## 2024-01-01 ENCOUNTER — Other Ambulatory Visit: Payer: Self-pay | Admitting: Physician Assistant

## 2024-01-01 DIAGNOSIS — E1165 Type 2 diabetes mellitus with hyperglycemia: Secondary | ICD-10-CM

## 2024-01-01 MED ORDER — EMPAGLIFLOZIN 25 MG PO TABS: 25.0000 mg | ORAL_TABLET | Freq: Every morning | ORAL | 0 refills | Status: DC

## 2024-01-21 ENCOUNTER — Other Ambulatory Visit: Payer: Self-pay | Admitting: Physician Assistant

## 2024-01-21 DIAGNOSIS — E1159 Type 2 diabetes mellitus with other circulatory complications: Secondary | ICD-10-CM

## 2024-03-04 ENCOUNTER — Ambulatory Visit: Admitting: Physician Assistant

## 2024-03-04 ENCOUNTER — Other Ambulatory Visit (HOSPITAL_BASED_OUTPATIENT_CLINIC_OR_DEPARTMENT_OTHER): Payer: Self-pay

## 2024-03-04 ENCOUNTER — Encounter: Payer: Self-pay | Admitting: Physician Assistant

## 2024-03-04 VITALS — BP 124/88 | HR 86 | Temp 97.8°F | Ht 74.0 in | Wt >= 6400 oz

## 2024-03-04 DIAGNOSIS — I152 Hypertension secondary to endocrine disorders: Secondary | ICD-10-CM

## 2024-03-04 DIAGNOSIS — E1169 Type 2 diabetes mellitus with other specified complication: Secondary | ICD-10-CM

## 2024-03-04 DIAGNOSIS — R400 Somnolence: Secondary | ICD-10-CM

## 2024-03-04 DIAGNOSIS — E1159 Type 2 diabetes mellitus with other circulatory complications: Secondary | ICD-10-CM

## 2024-03-04 DIAGNOSIS — E785 Hyperlipidemia, unspecified: Secondary | ICD-10-CM

## 2024-03-04 DIAGNOSIS — E1165 Type 2 diabetes mellitus with hyperglycemia: Secondary | ICD-10-CM | POA: Diagnosis not present

## 2024-03-04 MED ORDER — TIRZEPATIDE 15 MG/0.5ML ~~LOC~~ SOAJ
15.0000 mg | SUBCUTANEOUS | 1 refills | Status: DC
  Filled 2024-03-04: qty 6, 84d supply, fill #0
  Filled 2024-05-24: qty 6, 84d supply, fill #1

## 2024-03-04 NOTE — Progress Notes (Signed)
 Subjective:  Patient ID: William Massey, male    DOB: 1986-08-10  Age: 38 y.o. MRN: [id number]  Chief Complaint  Patient presents with   Medical Management of Chronic Issues    Diabetes  Hypertension    Pt presents for follow up of hypertension.  The patient is tolerating the medication well without side effects. Compliance with treatment has been good; including taking medication as directed , maintains a healthy diet and regular exercise regimen , and following up as directed. Pt currently on diovan  160mg  qd HCTZ 25mg  qd norvasc  10mg  and toprol  XL 200mg  qd - bp 124/88 Denies chest pain or dyspnea  Mixed hyperlipidemia  Pt presents with hyperlipidemia.  Compliance with treatment has beengood The patient is compliant with medications, maintains a low cholesterol diet , follows up as directed , and maintains an exercise regimen . The patient denies experiencing any hypercholesterolemia related symptoms. Currenlty taking lipitor 80mg  qd and vascepa   Pt with history of NIDDM.  He is currently taking jardiance  25mg  qd, actos  45mg  qd and Mounjaro  15mg  - he has been getting 3 month supply of that medication  Sugars have been lowering to under 100 fasting and pt has lost about 20 pounds since last visit  Pt with mild intermittent asthma - uses albuterol prn  Pt would like home sleep study - he has had daytime somnolence, snoring, waking up at night, etc  Current Outpatient Medications on File Prior to Visit  Medication Sig Dispense Refill   albuterol (VENTOLIN HFA) 108 (90 Base) MCG/ACT inhaler Inhale 1-2 puffs into the lungs every 6 (six) hours as needed for wheezing or shortness of breath.     amLODipine  (NORVASC ) 10 MG tablet TAKE 1 TABLET BY MOUTH DAILY. 90 tablet 0   atorvastatin  (LIPITOR) 80 MG tablet Take 1 tablet (80 mg total) by mouth daily. 90 tablet 1   Continuous Blood Gluc Sensor (DEXCOM G7 SENSOR) MISC To use qd for diabetes control 3 each 5   empagliflozin  (JARDIANCE ) 25 MG  TABS tablet Take 1 tablet (25 mg total) by mouth every morning. 90 tablet 0   fluticasone  (FLONASE ) 50 MCG/ACT nasal spray Place 2 sprays into both nostrils daily. 16 g 6   hydrochlorothiazide  (HYDRODIURIL ) 25 MG tablet TAKE 1 TABLET BY MOUTH DAILY 100 tablet 0   metoprolol  (TOPROL -XL) 200 MG 24 hr tablet TAKE 1 TABLET BY MOUTH DAILY 90 tablet 0   Multiple Vitamins-Minerals (MULTIVITAMIN ADULTS PO) Take by mouth.     pioglitazone  (ACTOS ) 45 MG tablet TAKE 1 TABLET BY MOUTH DAILY. 90 tablet 0   valsartan  (DIOVAN ) 160 MG tablet TAKE 1 TABLET BY MOUTH DAILY. 90 tablet 0   VASCEPA  1 g capsule TAKE 2 CAPSULES BY MOUTH 2 TIMES DAILY. 120 capsule 0   No current facility-administered medications on file prior to visit.   Past Medical History:  Diagnosis Date   Asthma    Diabetes mellitus without complication (HCC)    Generalized anxiety disorder    Hypertension    Mild intermittent asthma, uncomplicated    Mixed hyperlipidemia    Morbid (severe) obesity due to excess calories (HCC)    Personal history of malignant neoplasm of testis    Past Surgical History:  Procedure Laterality Date   ORCHIECTOMY Left 02/19/2015   Procedure: RADICAL ORCHIECTOMY;  Surgeon: Belvie LITTIE Clara, MD;  Location: [facility];  Service: Urology;  Laterality: Left;    Family History  Problem Relation Age of Onset   Hypertension Father  Diabetes type II Father    Leukemia Brother    Social History   Socioeconomic History   Marital status: Single    Spouse name: Not on file   Number of children: 1   Years of education: Not on file   Highest education level: Not on file  Occupational History   Not on file  Tobacco Use   Smoking status: Never   Smokeless tobacco: Never  Vaping Use   Vaping status: Never Used  Substance and Sexual Activity   Alcohol use: Yes    Comment: Drinks alcohol very infrequently. He typically consumes beer   Drug use: Not Currently    Frequency: 0.2 times per week    Types:  Marijuana   Sexual activity: Yes    Partners: Female  Other Topics Concern   Not on file  Social History Narrative   Not on file   Social Drivers of Health   Financial Resource Strain: Not on file  Food Insecurity: Not on file  Transportation Needs: Not on file  Physical Activity: Not on file  Stress: Not on file  Social Connections: Not on file   CONSTITUTIONAL: Negative for chills, fatigue, fever, unintentional weight gain and unintentional weight loss.  E/N/T: Negative for ear pain, nasal congestion and sore throat.  CARDIOVASCULAR: Negative for chest pain, dizziness, palpitations and pedal edema.  RESPIRATORY: Negative for recent cough and dyspnea.  GASTROINTESTINAL: Negative for abdominal pain, acid reflux symptoms, constipation, diarrhea, nausea and vomiting.  MSK: Negative for arthralgias and myalgias.  INTEGUMENTARY: Negative for rash.  NEUROLOGICAL: Negative for dizziness and headaches.  PSYCHIATRIC: Negative for sleep disturbance and to question depression screen.  Negative for depression, negative for anhedonia.       Objective:  PHYSICAL EXAM:   VS: BP 124/88   Pulse 86   Temp 97.8 F (36.6 C)   Ht 6' 2 (1.88 m)   Wt (!) 446 lb (202.3 kg)   SpO2 96%   BMI 57.26 kg/m   GEN: Well nourished, well developed, in no acute distress   Cardiac: RRR; no murmurs, rubs, or gallops,no edema -  Respiratory:  normal respiratory rate and pattern with no distress - normal breath sounds with no rales, rhonchi, wheezes or rubs  MS: no deformity or atrophy  Skin: warm and dry, no rash  Neuro:  Alert and Oriented x 3,  - CN II-Xii grossly intact Psych: euthymic mood, appropriate affect and demeanor  Diabetic Foot Exam - Simple   No data filed      Lab Results  Component Value Date   WBC 6.4 10/31/2023   HGB 17.1 10/31/2023   HCT 51.3 (H) 10/31/2023   PLT 205 10/31/2023   GLUCOSE 120 (H) 10/31/2023   CHOL 160 10/31/2023   TRIG 189 (H) 10/31/2023   HDL 45  10/31/2023   LDLCALC 83 10/31/2023   ALT 19 10/31/2023   AST 20 10/31/2023   NA 140 10/31/2023   K 4.4 10/31/2023   CL 100 10/31/2023   CREATININE 0.77 10/31/2023   BUN 13 10/31/2023   CO2 24 10/31/2023   TSH 2.440 06/20/2023   HGBA1C 6.5 (H) 10/31/2023   MICROALBUR negative 11/27/2019      Assessment & Plan:   Problem List Items Addressed This Visit       Cardiovascular and Mediastinum   Hypertension associated with diabetes (HCC) - Primary   Relevant Orders   CBC with Differential/Platelet   Comprehensive metabolic panel   TSH Continue current  meds     Endocrine   Type 2 diabetes mellitus with hyperglycemia(HCC)   Relevant Orders   CBC with Differential/Platelet   Comprehensive metabolic panel   Hemoglobin A1c Continue meds      Other   Hyperlipidemia associated with diabetes (HCC)    Relevant Orders   Lipid panel Continue meds  Low fat/low chol diet  Mild intermittent asthma Use albuterol prn  Morbid obesity (HCC) Continue meds/watch diet/efforts at weight loss  Daytime somnolence Pt would like home sleep study - will call back for which service he would like to use  .  Meds ordered this encounter  Medications   tirzepatide  (MOUNJARO ) 15 MG/0.5ML Pen    Sig: Inject 15 mg into the skin once a week.    Dispense:  6 mL    Refill:  1    Supervising Provider:   COX, KIRSTEN [phone]     Orders Placed This Encounter  Procedures   CBC with Differential/Platelet   Comprehensive metabolic panel with GFR   Lipid panel   Hemoglobin A1c     Follow-up: Return in about 4 months (around 07/05/2024) for chronic fasting follow-up.  An After Visit Summary was printed and given to the patient.  CAMIE JONELLE NICHOLAUS DEVONNA [facility] [phone]

## 2024-03-05 ENCOUNTER — Ambulatory Visit: Payer: Self-pay | Admitting: Physician Assistant

## 2024-03-05 LAB — COMPREHENSIVE METABOLIC PANEL WITH GFR
ALT: 19 IU/L (ref 0–44)
AST: 17 IU/L (ref 0–40)
Albumin: 4.3 g/dL (ref 4.1–5.1)
Alkaline Phosphatase: 69 IU/L (ref 44–121)
BUN/Creatinine Ratio: 17 (ref 9–20)
BUN: 14 mg/dL (ref 6–24)
Bilirubin Total: 0.8 mg/dL (ref 0.0–1.2)
CO2: 21 mmol/L (ref 20–29)
Calcium: 9.5 mg/dL (ref 8.7–10.2)
Chloride: 99 mmol/L (ref 96–106)
Creatinine, Ser: 0.81 mg/dL (ref 0.76–1.27)
Globulin, Total: 2.3 g/dL (ref 1.5–4.5)
Glucose: 115 mg/dL — ABNORMAL HIGH (ref 70–99)
Potassium: 4.3 mmol/L (ref 3.5–5.2)
Sodium: 138 mmol/L (ref 134–144)
Total Protein: 6.6 g/dL (ref 6.0–8.5)
eGFR: 114 mL/min/1.73 (ref >59)

## 2024-03-05 LAB — LIPID PANEL
Chol/HDL Ratio: 3.6 ratio (ref 0.0–5.0)
Cholesterol, Total: 157 mg/dL (ref 100–199)
HDL: 44 mg/dL (ref >39)
LDL Chol Calc (NIH): 82 mg/dL (ref 0–99)
Triglycerides: 181 mg/dL — ABNORMAL HIGH (ref 0–149)
VLDL Cholesterol Cal: 31 mg/dL (ref 5–40)

## 2024-03-05 LAB — CBC WITH DIFFERENTIAL/PLATELET
Basophils Absolute: 0.1 x10E3/uL (ref 0.0–0.2)
Basos: 1 %
EOS (ABSOLUTE): 0.1 x10E3/uL (ref 0.0–0.4)
Eos: 2 %
Hematocrit: 51.1 % — ABNORMAL HIGH (ref 37.5–51.0)
Hemoglobin: 16.8 g/dL (ref 13.0–17.7)
Immature Grans (Abs): 0 x10E3/uL (ref 0.0–0.1)
Immature Granulocytes: 0 %
Lymphocytes Absolute: 1.9 x10E3/uL (ref 0.7–3.1)
Lymphs: 31 %
MCH: 28.8 pg (ref 26.6–33.0)
MCHC: 32.9 g/dL (ref 31.5–35.7)
MCV: 88 fL (ref 79–97)
Monocytes Absolute: 0.5 x10E3/uL (ref 0.1–0.9)
Monocytes: 8 %
Neutrophils Absolute: 3.4 x10E3/uL (ref 1.4–7.0)
Neutrophils: 58 %
Platelets: 187 x10E3/uL (ref 150–450)
RBC: 5.83 x10E6/uL — ABNORMAL HIGH (ref 4.14–5.80)
RDW: 13.5 % (ref 11.6–15.4)
WBC: 5.9 x10E3/uL (ref 3.4–10.8)

## 2024-03-05 LAB — HEMOGLOBIN A1C
Est. average glucose Bld gHb Est-mCnc: 140 mg/dL
Hgb A1c MFr Bld: 6.5 % — ABNORMAL HIGH (ref 4.8–5.6)

## 2024-03-20 ENCOUNTER — Other Ambulatory Visit: Payer: Self-pay | Admitting: Physician Assistant

## 2024-03-20 DIAGNOSIS — E1159 Type 2 diabetes mellitus with other circulatory complications: Secondary | ICD-10-CM

## 2024-04-18 ENCOUNTER — Other Ambulatory Visit: Payer: Self-pay

## 2024-04-18 ENCOUNTER — Other Ambulatory Visit: Payer: Self-pay | Admitting: Physician Assistant

## 2024-04-18 DIAGNOSIS — E1159 Type 2 diabetes mellitus with other circulatory complications: Secondary | ICD-10-CM

## 2024-04-18 DIAGNOSIS — E1165 Type 2 diabetes mellitus with hyperglycemia: Secondary | ICD-10-CM

## 2024-05-22 ENCOUNTER — Other Ambulatory Visit: Payer: Self-pay | Admitting: Physician Assistant

## 2024-05-22 DIAGNOSIS — E1165 Type 2 diabetes mellitus with hyperglycemia: Secondary | ICD-10-CM

## 2024-05-25 ENCOUNTER — Other Ambulatory Visit (HOSPITAL_BASED_OUTPATIENT_CLINIC_OR_DEPARTMENT_OTHER): Payer: Self-pay

## 2024-05-26 ENCOUNTER — Other Ambulatory Visit (HOSPITAL_BASED_OUTPATIENT_CLINIC_OR_DEPARTMENT_OTHER): Payer: Self-pay

## 2024-06-09 ENCOUNTER — Telehealth: Payer: Self-pay | Admitting: Physician Assistant

## 2024-06-09 NOTE — Telephone Encounter (Signed)
 Called and left voicemail for patient to call the office to reschedule 07/07/24 appointment due to the provider being out of office. Also sent a MyChart message.

## 2024-06-16 ENCOUNTER — Ambulatory Visit: Admitting: Physician Assistant

## 2024-06-16 ENCOUNTER — Encounter: Payer: Self-pay | Admitting: Physician Assistant

## 2024-06-16 VITALS — BP 130/82 | HR 97 | Temp 98.2°F | Resp 18 | Ht 74.0 in | Wt >= 6400 oz

## 2024-06-16 DIAGNOSIS — J4520 Mild intermittent asthma, uncomplicated: Secondary | ICD-10-CM | POA: Diagnosis not present

## 2024-06-16 DIAGNOSIS — E1169 Type 2 diabetes mellitus with other specified complication: Secondary | ICD-10-CM | POA: Diagnosis not present

## 2024-06-16 DIAGNOSIS — E1165 Type 2 diabetes mellitus with hyperglycemia: Secondary | ICD-10-CM | POA: Diagnosis not present

## 2024-06-16 DIAGNOSIS — E1159 Type 2 diabetes mellitus with other circulatory complications: Secondary | ICD-10-CM

## 2024-06-16 DIAGNOSIS — I152 Hypertension secondary to endocrine disorders: Secondary | ICD-10-CM

## 2024-06-16 DIAGNOSIS — E785 Hyperlipidemia, unspecified: Secondary | ICD-10-CM

## 2024-06-16 LAB — CBC WITH DIFFERENTIAL/PLATELET
Basophils Absolute: 0 x10E3/uL (ref 0.0–0.2)
Basos: 1 %
EOS (ABSOLUTE): 0.1 x10E3/uL (ref 0.0–0.4)
Eos: 1 %
Hematocrit: 49.8 % (ref 37.5–51.0)
Hemoglobin: 16.1 g/dL (ref 13.0–17.7)
Immature Grans (Abs): 0 x10E3/uL (ref 0.0–0.1)
Immature Granulocytes: 0 %
Lymphocytes Absolute: 1.8 x10E3/uL (ref 0.7–3.1)
Lymphs: 34 %
MCH: 28.3 pg (ref 26.6–33.0)
MCHC: 32.3 g/dL (ref 31.5–35.7)
MCV: 88 fL (ref 79–97)
Monocytes Absolute: 0.4 x10E3/uL (ref 0.1–0.9)
Monocytes: 9 %
Neutrophils Absolute: 2.8 x10E3/uL (ref 1.4–7.0)
Neutrophils: 55 %
Platelets: 180 x10E3/uL (ref 150–450)
RBC: 5.68 x10E6/uL (ref 4.14–5.80)
RDW: 13.9 % (ref 11.6–15.4)
WBC: 5.1 x10E3/uL (ref 3.4–10.8)

## 2024-06-16 LAB — HEMOGLOBIN A1C
Est. average glucose Bld gHb Est-mCnc: 131 mg/dL
Hgb A1c MFr Bld: 6.2 % — ABNORMAL HIGH (ref 4.8–5.6)

## 2024-06-16 LAB — COMPREHENSIVE METABOLIC PANEL WITH GFR
ALT: 17 IU/L (ref 0–44)
AST: 13 IU/L (ref 0–40)
Albumin: 4.2 g/dL (ref 4.1–5.1)
Alkaline Phosphatase: 69 IU/L (ref 47–123)
BUN/Creatinine Ratio: 19 (ref 9–20)
BUN: 13 mg/dL (ref 6–24)
Bilirubin Total: 0.8 mg/dL (ref 0.0–1.2)
CO2: 25 mmol/L (ref 20–29)
Calcium: 9.3 mg/dL (ref 8.7–10.2)
Chloride: 103 mmol/L (ref 96–106)
Creatinine, Ser: 0.67 mg/dL — ABNORMAL LOW (ref 0.76–1.27)
Globulin, Total: 2.1 g/dL (ref 1.5–4.5)
Glucose: 108 mg/dL — ABNORMAL HIGH (ref 70–99)
Potassium: 4.5 mmol/L (ref 3.5–5.2)
Sodium: 141 mmol/L (ref 134–144)
Total Protein: 6.3 g/dL (ref 6.0–8.5)
eGFR: 121 mL/min/1.73 (ref >59)

## 2024-06-16 LAB — LIPID PANEL
Chol/HDL Ratio: 3 ratio (ref 0.0–5.0)
Cholesterol, Total: 152 mg/dL (ref 100–199)
HDL: 50 mg/dL (ref >39)
LDL Chol Calc (NIH): 80 mg/dL (ref 0–99)
Triglycerides: 126 mg/dL (ref 0–149)
VLDL Cholesterol Cal: 22 mg/dL (ref 5–40)

## 2024-06-16 LAB — TSH: TSH: 2.52 u[IU]/mL (ref 0.450–4.500)

## 2024-06-16 MED ORDER — METOPROLOL SUCCINATE ER 200 MG PO TB24: 200.0000 mg | ORAL_TABLET | Freq: Every day | ORAL | 1 refills | Status: AC

## 2024-06-16 NOTE — Progress Notes (Signed)
 Subjective:  Patient ID: William Massey, male    DOB: [birth year]  Age: 38 y.o. MRN: [id number]  Chief Complaint  Patient presents with   Medical Management of Chronic Issues    Diabetes  Hypertension    Pt presents for follow up of hypertension.  The patient is tolerating the medication well without side effects. Compliance with treatment has been good; including taking medication as directed , maintains a healthy diet and regular exercise regimen , and following up as directed. Pt currently on diovan  160mg  qd HCTZ 25mg  qd norvasc  10mg  and toprol  XL 200mg  qd - bp 130/82 Denies chest pain or dyspnea  Mixed hyperlipidemia  Pt presents with hyperlipidemia.  Compliance with treatment has beengood The patient is compliant with medications, maintains a low cholesterol diet , follows up as directed , and maintains an exercise regimen . The patient denies experiencing any hypercholesterolemia related symptoms. Currenlty taking lipitor 80mg  qd and vascepa   Pt with history of NIDDM.  He is currently taking jardiance  25mg  qd, actos  45mg  qd and Mounjaro  15mg  - he has been getting 3 month supply of that medication  Sugars have been lowering to under 100 -120 fasting with highest reading nonfasting around 180 - and pt has lost about 10 pounds since last visit  Pt with mild intermittent asthma - uses albuterol prn    Current Outpatient Medications on File Prior to Visit  Medication Sig Dispense Refill   albuterol (VENTOLIN HFA) 108 (90 Base) MCG/ACT inhaler Inhale 1-2 puffs into the lungs every 6 (six) hours as needed for wheezing or shortness of breath.     amLODipine  (NORVASC ) 10 MG tablet TAKE 1 TABLET BY MOUTH DAILY. 90 tablet 0   atorvastatin  (LIPITOR) 80 MG tablet Take 1 tablet (80 mg total) by mouth daily. 90 tablet 1   Continuous Blood Gluc Sensor (DEXCOM G7 SENSOR) MISC To use qd for diabetes control 3 each 5   fluticasone  (FLONASE ) 50 MCG/ACT nasal spray Place 2 sprays into both  nostrils daily. 16 g 6   hydrochlorothiazide  (HYDRODIURIL ) 25 MG tablet TAKE 1 TABLET BY MOUTH DAILY 100 tablet 0   JARDIANCE  25 MG TABS tablet TAKE 1 TABLET BY MOUTH EVERY MORNING. 90 tablet 0   Multiple Vitamins-Minerals (MULTIVITAMIN ADULTS PO) Take by mouth.     pioglitazone  (ACTOS ) 45 MG tablet TAKE 1 TABLET BY MOUTH DAILY. 90 tablet 0   tirzepatide  (MOUNJARO ) 15 MG/0.5ML Pen Inject 15 mg into the skin once a week. 6 mL 1   valsartan  (DIOVAN ) 160 MG tablet TAKE 1 TABLET BY MOUTH DAILY. 90 tablet 0   VASCEPA  1 g capsule TAKE 2 CAPSULES BY MOUTH 2 TIMES DAILY. 120 capsule 0   No current facility-administered medications on file prior to visit.   Past Medical History:  Diagnosis Date   Asthma    Diabetes mellitus without complication (HCC)    Generalized anxiety disorder    Hypertension    Mild intermittent asthma, uncomplicated    Mixed hyperlipidemia    Morbid (severe) obesity due to excess calories (HCC)    Personal history of malignant neoplasm of testis    Past Surgical History:  Procedure Laterality Date   ORCHIECTOMY Left 02/19/2015   Procedure: RADICAL ORCHIECTOMY;  Surgeon: Belvie LITTIE Clara, MD;  Location: [facility];  Service: Urology;  Laterality: Left;    Family History  Problem Relation Age of Onset   Hypertension Father    Diabetes type II Father    Leukemia Brother  Social History   Socioeconomic History   Marital status: Single    Spouse name: Not on file   Number of children: 1   Years of education: Not on file   Highest education level: Not on file  Occupational History   Not on file  Tobacco Use   Smoking status: Never   Smokeless tobacco: Never  Vaping Use   Vaping status: Never Used  Substance and Sexual Activity   Alcohol use: Yes    Comment: Drinks alcohol very infrequently. He typically consumes beer   Drug use: Not Currently    Frequency: 0.2 times per week    Types: Marijuana   Sexual activity: Yes    Partners: Female  Other Topics  Concern   Not on file  Social History Narrative   Not on file   Social Drivers of Health   Financial Resource Strain: Not on file  Food Insecurity: Not on file  Transportation Needs: Not on file  Physical Activity: Not on file  Stress: Not on file  Social Connections: Not on file   CONSTITUTIONAL: Negative for chills, fatigue, fever, unintentional weight gain and unintentional weight loss.  CARDIOVASCULAR: Negative for chest pain, dizziness, palpitations and pedal edema.  RESPIRATORY: Negative for recent cough and dyspnea.  GASTROINTESTINAL: Negative for abdominal pain, acid reflux symptoms, constipation, diarrhea, nausea and vomiting.  MSK: Negative for arthralgias and myalgias.  INTEGUMENTARY: Negative for rash.  PSYCHIATRIC: Negative for sleep disturbance and to question depression screen.  Negative for depression, negative for anhedonia.       Objective:  PHYSICAL EXAM:   VS: BP 130/82   Pulse 97   Temp 98.2 F (36.8 C) (Temporal)   Resp 18   Ht 6' 2 (1.88 m)   Wt (!) 438 lb 3.2 oz (198.8 kg)   SpO2 95%   BMI 56.26 kg/m   GEN: Well nourished, well developed, in no acute distress   Cardiac: RRR; no murmurs, rubs, or gallops,no edema - Respiratory:  normal respiratory rate and pattern with no distress - normal breath sounds with no rales, rhonchi, wheezes or rubs  MS: no deformity or atrophy  Skin: warm and dry, no rash  Neuro:  Alert and Oriented x 3, - CN II-Xii grossly intact Psych: euthymic mood, appropriate affect and demeanor  Diabetic Foot Exam - Simple   Simple Foot Form Diabetic Foot exam was performed with the following findings: Yes 06/16/2024  9:30 AM  Visual Inspection No deformities, no ulcerations, no other skin breakdown bilaterally: Yes Sensation Testing Intact to touch and monofilament testing bilaterally: Yes Pulse Check Posterior Tibialis and Dorsalis pulse intact bilaterally: Yes Comments     Lab Results  Component Value Date    WBC 5.9 03/04/2024   HGB 16.8 03/04/2024   HCT 51.1 (H) 03/04/2024   PLT 187 03/04/2024   GLUCOSE 115 (H) 03/04/2024   CHOL 157 03/04/2024   TRIG 181 (H) 03/04/2024   HDL 44 03/04/2024   LDLCALC 82 03/04/2024   ALT 19 03/04/2024   AST 17 03/04/2024   NA 138 03/04/2024   K 4.3 03/04/2024   CL 99 03/04/2024   CREATININE 0.81 03/04/2024   BUN 14 03/04/2024   CO2 21 03/04/2024   TSH 2.440 06/20/2023   HGBA1C 6.5 (H) 03/04/2024   MICROALBUR negative 11/27/2019      Assessment & Plan:   Problem List Items Addressed This Visit       Cardiovascular and Mediastinum   Hypertension associated with diabetes (  HCC) - Primary   Relevant Orders   CBC with Differential/Platelet   Comprehensive metabolic panel   TSH Continue current meds     Endocrine   Type 2 diabetes mellitus with hyperglycemia(HCC)   Relevant Orders   CBC with Differential/Platelet   Comprehensive metabolic panel   Hemoglobin A1c Continue meds      Other   Hyperlipidemia associated with diabetes (HCC)    Relevant Orders   Lipid panel Continue meds  Low fat/low chol diet  Mild intermittent asthma Use albuterol prn  Morbid obesity (HCC) Continue meds/watch diet/efforts at weight loss    .  Meds ordered this encounter  Medications   metoprolol  (TOPROL -XL) 200 MG 24 hr tablet    Sig: Take 1 tablet (200 mg total) by mouth daily.    Dispense:  90 tablet    Refill:  1    Supervising Provider:   COX, KIRSTEN [phone]     Orders Placed This Encounter  Procedures   CBC with Differential/Platelet   Comprehensive metabolic panel with GFR   TSH   Lipid panel   Hemoglobin A1c     Follow-up: Return in about 6 months (around 12/15/2024) for chronic fasting follow-up.  An After Visit Summary was printed and given to the patient.  CAMIE JONELLE NICHOLAUS DEVONNA [facility] [phone]

## 2024-06-17 ENCOUNTER — Ambulatory Visit: Payer: Self-pay | Admitting: Physician Assistant

## 2024-07-07 ENCOUNTER — Ambulatory Visit: Admitting: Physician Assistant

## 2024-07-16 ENCOUNTER — Other Ambulatory Visit: Payer: Self-pay | Admitting: Physician Assistant

## 2024-07-16 DIAGNOSIS — E1165 Type 2 diabetes mellitus with hyperglycemia: Secondary | ICD-10-CM

## 2024-07-16 DIAGNOSIS — I152 Hypertension secondary to endocrine disorders: Secondary | ICD-10-CM

## 2024-07-25 DIAGNOSIS — M5412 Radiculopathy, cervical region: Secondary | ICD-10-CM | POA: Diagnosis not present

## 2024-07-25 DIAGNOSIS — R0789 Other chest pain: Secondary | ICD-10-CM | POA: Diagnosis not present

## 2024-07-25 DIAGNOSIS — R079 Chest pain, unspecified: Secondary | ICD-10-CM | POA: Diagnosis not present

## 2024-08-18 ENCOUNTER — Other Ambulatory Visit: Payer: Self-pay | Admitting: Physician Assistant

## 2024-08-19 ENCOUNTER — Other Ambulatory Visit (HOSPITAL_BASED_OUTPATIENT_CLINIC_OR_DEPARTMENT_OTHER): Payer: Self-pay

## 2024-08-19 MED ORDER — MOUNJARO 15 MG/0.5ML ~~LOC~~ SOAJ
15.0000 mg | SUBCUTANEOUS | 1 refills | Status: AC
  Filled 2024-08-19: qty 6, 84d supply, fill #0

## 2024-08-20 ENCOUNTER — Other Ambulatory Visit: Payer: Self-pay

## 2024-08-20 ENCOUNTER — Other Ambulatory Visit: Payer: Self-pay | Admitting: Physician Assistant

## 2024-08-20 DIAGNOSIS — E1159 Type 2 diabetes mellitus with other circulatory complications: Secondary | ICD-10-CM

## 2024-08-20 DIAGNOSIS — E1169 Type 2 diabetes mellitus with other specified complication: Secondary | ICD-10-CM

## 2024-12-18 ENCOUNTER — Ambulatory Visit: Admitting: Physician Assistant
